# Patient Record
Sex: Female | Born: 1985 | Race: White | Hispanic: No | Marital: Single | Smoking: Never smoker
Health system: Southern US, Community
[De-identification: ages and names within clinical notes are randomized; demographics above are authoritative.]

## PROBLEM LIST (undated history)

## (undated) DIAGNOSIS — D480 Neoplasm of uncertain behavior of bone and articular cartilage: Secondary | ICD-10-CM

## (undated) DIAGNOSIS — W5501XA Bitten by cat, initial encounter: Secondary | ICD-10-CM

## (undated) DIAGNOSIS — D169 Benign neoplasm of bone and articular cartilage, unspecified: Secondary | ICD-10-CM

## (undated) HISTORY — PX: ELBOW SURGERY: SHX618

## (undated) HISTORY — PX: HIP SURGERY: SHX245

---

## 2015-07-10 DIAGNOSIS — W5501XA Bitten by cat, initial encounter: Secondary | ICD-10-CM

## 2015-07-10 HISTORY — DX: Bitten by cat, initial encounter: W55.01XA

## 2015-07-13 ENCOUNTER — Encounter (HOSPITAL_BASED_OUTPATIENT_CLINIC_OR_DEPARTMENT_OTHER): Payer: Self-pay | Admitting: Emergency Medicine

## 2015-07-13 DIAGNOSIS — Z3A14 14 weeks gestation of pregnancy: Secondary | ICD-10-CM

## 2015-07-13 DIAGNOSIS — Z23 Encounter for immunization: Secondary | ICD-10-CM

## 2015-07-13 DIAGNOSIS — L03114 Cellulitis of left upper limb: Secondary | ICD-10-CM | POA: Diagnosis present

## 2015-07-13 DIAGNOSIS — S61452A Open bite of left hand, initial encounter: Secondary | ICD-10-CM | POA: Diagnosis present

## 2015-07-13 DIAGNOSIS — W5501XA Bitten by cat, initial encounter: Secondary | ICD-10-CM

## 2015-07-13 DIAGNOSIS — M659 Synovitis and tenosynovitis, unspecified: Secondary | ICD-10-CM | POA: Diagnosis present

## 2015-07-13 DIAGNOSIS — O210 Mild hyperemesis gravidarum: Secondary | ICD-10-CM | POA: Diagnosis present

## 2015-07-13 DIAGNOSIS — O9A212 Injury, poisoning and certain other consequences of external causes complicating pregnancy, second trimester: Secondary | ICD-10-CM | POA: Diagnosis present

## 2015-07-13 DIAGNOSIS — L02512 Cutaneous abscess of left hand: Secondary | ICD-10-CM | POA: Diagnosis present

## 2015-07-13 DIAGNOSIS — B998 Other infectious disease: Secondary | ICD-10-CM | POA: Diagnosis present

## 2015-07-13 DIAGNOSIS — O99712 Diseases of the skin and subcutaneous tissue complicating pregnancy, second trimester: Principal | ICD-10-CM | POA: Diagnosis present

## 2015-07-13 DIAGNOSIS — D480 Neoplasm of uncertain behavior of bone and articular cartilage: Secondary | ICD-10-CM | POA: Diagnosis present

## 2015-07-13 NOTE — ED Notes (Signed)
Patient states that she was scratched by her cat to the left hand 2 days ago. The patient has pus and drainage to the area. The patient is [redacted] weeks pregnant

## 2015-07-14 ENCOUNTER — Emergency Department (HOSPITAL_COMMUNITY): Payer: Medicaid Other | Admitting: Anesthesiology

## 2015-07-14 ENCOUNTER — Inpatient Hospital Stay (HOSPITAL_BASED_OUTPATIENT_CLINIC_OR_DEPARTMENT_OTHER)
Admission: EM | Admit: 2015-07-14 | Discharge: 2015-07-15 | DRG: 781 | Disposition: A | Discharge status: Home / Self Care | Payer: Medicaid Other | Attending: Internal Medicine | Admitting: Internal Medicine

## 2015-07-14 ENCOUNTER — Encounter (HOSPITAL_COMMUNITY)
Admission: EM | Disposition: A | Discharge status: Home / Self Care | Payer: Self-pay | Source: Home / Self Care | Attending: Orthopedic Surgery

## 2015-07-14 ENCOUNTER — Emergency Department (HOSPITAL_BASED_OUTPATIENT_CLINIC_OR_DEPARTMENT_OTHER): Payer: Medicaid Other

## 2015-07-14 ENCOUNTER — Encounter (HOSPITAL_BASED_OUTPATIENT_CLINIC_OR_DEPARTMENT_OTHER): Payer: Self-pay | Admitting: Radiology

## 2015-07-14 DIAGNOSIS — W5501XD Bitten by cat, subsequent encounter: Secondary | ICD-10-CM | POA: Diagnosis not present

## 2015-07-14 DIAGNOSIS — W5501XA Bitten by cat, initial encounter: Secondary | ICD-10-CM | POA: Diagnosis not present

## 2015-07-14 DIAGNOSIS — D480 Neoplasm of uncertain behavior of bone and articular cartilage: Secondary | ICD-10-CM | POA: Diagnosis present

## 2015-07-14 DIAGNOSIS — S61459A Open bite of unspecified hand, initial encounter: Secondary | ICD-10-CM | POA: Diagnosis present

## 2015-07-14 DIAGNOSIS — M659 Synovitis and tenosynovitis, unspecified: Secondary | ICD-10-CM | POA: Diagnosis present

## 2015-07-14 DIAGNOSIS — S61452A Open bite of left hand, initial encounter: Secondary | ICD-10-CM | POA: Diagnosis present

## 2015-07-14 DIAGNOSIS — M65949 Unspecified synovitis and tenosynovitis, unspecified hand: Secondary | ICD-10-CM | POA: Diagnosis present

## 2015-07-14 DIAGNOSIS — Z23 Encounter for immunization: Secondary | ICD-10-CM | POA: Diagnosis not present

## 2015-07-14 DIAGNOSIS — L02512 Cutaneous abscess of left hand: Secondary | ICD-10-CM | POA: Diagnosis present

## 2015-07-14 DIAGNOSIS — D169 Benign neoplasm of bone and articular cartilage, unspecified: Secondary | ICD-10-CM | POA: Diagnosis present

## 2015-07-14 DIAGNOSIS — Z3A14 14 weeks gestation of pregnancy: Secondary | ICD-10-CM | POA: Diagnosis not present

## 2015-07-14 DIAGNOSIS — L03114 Cellulitis of left upper limb: Secondary | ICD-10-CM | POA: Diagnosis present

## 2015-07-14 DIAGNOSIS — O210 Mild hyperemesis gravidarum: Secondary | ICD-10-CM | POA: Diagnosis present

## 2015-07-14 DIAGNOSIS — B998 Other infectious disease: Secondary | ICD-10-CM | POA: Diagnosis present

## 2015-07-14 DIAGNOSIS — O99712 Diseases of the skin and subcutaneous tissue complicating pregnancy, second trimester: Secondary | ICD-10-CM | POA: Diagnosis present

## 2015-07-14 DIAGNOSIS — O9A212 Injury, poisoning and certain other consequences of external causes complicating pregnancy, second trimester: Secondary | ICD-10-CM | POA: Diagnosis present

## 2015-07-14 DIAGNOSIS — Z3492 Encounter for supervision of normal pregnancy, unspecified, second trimester: Secondary | ICD-10-CM

## 2015-07-14 DIAGNOSIS — S61452D Open bite of left hand, subsequent encounter: Secondary | ICD-10-CM | POA: Diagnosis not present

## 2015-07-14 HISTORY — DX: Neoplasm of uncertain behavior of bone and articular cartilage: D48.0

## 2015-07-14 HISTORY — DX: Benign neoplasm of bone and articular cartilage, unspecified: D16.9

## 2015-07-14 HISTORY — PX: I & D EXTREMITY: SHX5045

## 2015-07-14 HISTORY — PX: INCISION / DRAINAGE HAND / FINGER: SUR695

## 2015-07-14 HISTORY — DX: Bitten by cat, initial encounter: W55.01XA

## 2015-07-14 LAB — CBC WITH DIFFERENTIAL/PLATELET
BASOS ABS: 0 10*3/uL (ref 0.0–0.1)
BASOS PCT: 0 %
EOS ABS: 0.1 10*3/uL (ref 0.0–0.7)
Eosinophils Relative: 1 %
HEMATOCRIT: 41.2 % (ref 36.0–46.0)
HEMOGLOBIN: 14 g/dL (ref 12.0–15.0)
Lymphocytes Relative: 25 %
Lymphs Abs: 3.1 10*3/uL (ref 0.7–4.0)
MCH: 30 pg (ref 26.0–34.0)
MCHC: 34 g/dL (ref 30.0–36.0)
MCV: 88.2 fL (ref 78.0–100.0)
MONO ABS: 1 10*3/uL (ref 0.1–1.0)
MONOS PCT: 8 %
NEUTROS PCT: 66 %
Neutro Abs: 8.1 10*3/uL — ABNORMAL HIGH (ref 1.7–7.7)
Platelets: 255 10*3/uL (ref 150–400)
RBC: 4.67 MIL/uL (ref 3.87–5.11)
RDW: 13.2 % (ref 11.5–15.5)
WBC: 12.2 10*3/uL — ABNORMAL HIGH (ref 4.0–10.5)

## 2015-07-14 LAB — BASIC METABOLIC PANEL
ANION GAP: 11 (ref 5–15)
BUN: 10 mg/dL (ref 6–20)
CHLORIDE: 103 mmol/L (ref 101–111)
CO2: 20 mmol/L — AB (ref 22–32)
Calcium: 9.1 mg/dL (ref 8.9–10.3)
Creatinine, Ser: 0.57 mg/dL (ref 0.44–1.00)
GFR calc non Af Amer: >60 mL/min (ref >60)
Glucose, Bld: 107 mg/dL — ABNORMAL HIGH (ref 65–99)
POTASSIUM: 3.7 mmol/L (ref 3.5–5.1)
SODIUM: 134 mmol/L — AB (ref 135–145)

## 2015-07-14 SURGERY — IRRIGATION AND DEBRIDEMENT EXTREMITY
Anesthesia: Regional | Site: Hand | Laterality: Left

## 2015-07-14 MED ORDER — OXYCODONE HCL 5 MG PO TABS: 5.0000 mg | ORAL_TABLET | ORAL | Status: DC | PRN

## 2015-07-14 MED ORDER — ONDANSETRON HCL 4 MG/2ML IJ SOLN: 4.0000 mg | Freq: Four times a day (QID) | INTRAMUSCULAR | Status: DC | PRN

## 2015-07-14 MED ORDER — TETANUS-DIPHTH-ACELL PERTUSSIS 5-2.5-18.5 LF-MCG/0.5 IM SUSP
0.5000 mL | Freq: Once | INTRAMUSCULAR | Status: AC
  Administered 2015-07-14: 0.5 mL via INTRAMUSCULAR
  Filled 2015-07-14: qty 0.5

## 2015-07-14 MED ORDER — AMOXICILLIN-POT CLAVULANATE 875-125 MG PO TABS
1.0000 | ORAL_TABLET | Freq: Two times a day (BID) | ORAL | Status: DC
  Administered 2015-07-14 – 2015-07-15 (×2): 1 via ORAL
  Filled 2015-07-14 (×2): qty 1

## 2015-07-14 MED ORDER — MIDAZOLAM HCL 2 MG/2ML IJ SOLN
INTRAMUSCULAR | Status: AC
  Filled 2015-07-14: qty 2

## 2015-07-14 MED ORDER — DOXYLAMINE SUCCINATE (SLEEP) 25 MG PO TABS
25.0000 mg | ORAL_TABLET | Freq: Every evening | ORAL | Status: DC | PRN
  Filled 2015-07-14: qty 1

## 2015-07-14 MED ORDER — FENTANYL CITRATE (PF) 250 MCG/5ML IJ SOLN
INTRAMUSCULAR | Status: AC
  Filled 2015-07-14: qty 5

## 2015-07-14 MED ORDER — DIPHENHYDRAMINE HCL 25 MG PO CAPS: 25.0000 mg | ORAL_CAPSULE | Freq: Four times a day (QID) | ORAL | Status: DC | PRN

## 2015-07-14 MED ORDER — DOCUSATE SODIUM 100 MG PO CAPS: 100.0000 mg | ORAL_CAPSULE | Freq: Two times a day (BID) | ORAL | Status: DC

## 2015-07-14 MED ORDER — SODIUM CHLORIDE 0.9 % IV SOLN: 3.0000 g | Freq: Once | INTRAVENOUS | Status: DC

## 2015-07-14 MED ORDER — SODIUM CHLORIDE 0.9 % IR SOLN
Status: DC | PRN
  Administered 2015-07-14: 3000 mL

## 2015-07-14 MED ORDER — LIDOCAINE-EPINEPHRINE (PF) 1.5 %-1:200000 IJ SOLN
INTRAMUSCULAR | Status: DC | PRN
  Administered 2015-07-14: 30 mL via PERINEURAL

## 2015-07-14 MED ORDER — ACETAMINOPHEN 500 MG PO TABS: 500.0000 mg | ORAL_TABLET | Freq: Four times a day (QID) | ORAL | Status: DC | PRN

## 2015-07-14 MED ORDER — MORPHINE SULFATE (PF) 2 MG/ML IV SOLN: 2.0000 mg | INTRAVENOUS | Status: DC | PRN

## 2015-07-14 MED ORDER — MAGNESIUM CITRATE PO SOLN: 1.0000 | Freq: Once | ORAL | Status: DC | PRN

## 2015-07-14 MED ORDER — POTASSIUM CHLORIDE IN NACL 20-0.45 MEQ/L-% IV SOLN
INTRAVENOUS | Status: DC
  Filled 2015-07-14 (×4): qty 1000

## 2015-07-14 MED ORDER — SODIUM CHLORIDE 0.9 % IV SOLN
3.0000 g | Freq: Four times a day (QID) | INTRAVENOUS | Status: DC
  Administered 2015-07-14: 3 g via INTRAVENOUS
  Filled 2015-07-14 (×3): qty 3

## 2015-07-14 MED ORDER — HYDROMORPHONE HCL 1 MG/ML IJ SOLN: 0.2500 mg | INTRAMUSCULAR | Status: DC | PRN

## 2015-07-14 MED ORDER — SODIUM CHLORIDE 0.45 % IV SOLN
INTRAVENOUS | Status: DC
Start: 2015-07-14 — End: 2015-07-15
  Administered 2015-07-14 – 2015-07-15 (×2): via INTRAVENOUS

## 2015-07-14 MED ORDER — PROMETHAZINE HCL 25 MG RE SUPP: 12.5000 mg | Freq: Four times a day (QID) | RECTAL | Status: DC | PRN

## 2015-07-14 MED ORDER — ONDANSETRON HCL 4 MG PO TABS: 4.0000 mg | ORAL_TABLET | Freq: Four times a day (QID) | ORAL | Status: DC | PRN

## 2015-07-14 MED ORDER — BISACODYL 10 MG RE SUPP: 10.0000 mg | Freq: Every day | RECTAL | Status: DC | PRN

## 2015-07-14 MED ORDER — MEPERIDINE HCL 25 MG/ML IJ SOLN: 6.2500 mg | INTRAMUSCULAR | Status: DC | PRN

## 2015-07-14 MED ORDER — PRENATAL MULTIVITAMIN CH
1.0000 | ORAL_TABLET | Freq: Every day | ORAL | Status: DC
  Administered 2015-07-14 – 2015-07-15 (×2): 1 via ORAL
  Filled 2015-07-14 (×2): qty 1

## 2015-07-14 MED ORDER — SODIUM CHLORIDE 0.9 % IV SOLN
3.0000 g | Freq: Once | INTRAVENOUS | Status: AC
  Administered 2015-07-14: 3 g via INTRAVENOUS
  Filled 2015-07-14: qty 3

## 2015-07-14 MED ORDER — PYRIDOXINE HCL 25 MG PO TABS
25.0000 mg | ORAL_TABLET | Freq: Three times a day (TID) | ORAL | Status: DC | PRN
  Filled 2015-07-14: qty 1

## 2015-07-14 MED ORDER — FAMOTIDINE 20 MG PO TABS: 20.0000 mg | ORAL_TABLET | Freq: Two times a day (BID) | ORAL | Status: DC | PRN

## 2015-07-14 MED ORDER — PROPOFOL 10 MG/ML IV BOLUS
INTRAVENOUS | Status: AC
  Filled 2015-07-14: qty 20

## 2015-07-14 MED ORDER — PROPOFOL 1000 MG/100ML IV EMUL
INTRAVENOUS | Status: AC
  Filled 2015-07-14: qty 100

## 2015-07-14 MED ORDER — SENNA 8.6 MG PO TABS: 1.0000 | ORAL_TABLET | Freq: Two times a day (BID) | ORAL | Status: DC

## 2015-07-14 MED ORDER — ONDANSETRON HCL 4 MG/2ML IJ SOLN: 4.0000 mg | Freq: Once | INTRAMUSCULAR | Status: DC | PRN

## 2015-07-14 MED ORDER — POLYETHYLENE GLYCOL 3350 17 G PO PACK: 17.0000 g | PACK | Freq: Every day | ORAL | Status: DC | PRN

## 2015-07-14 MED ORDER — LACTATED RINGERS IV SOLN
INTRAVENOUS | Status: DC | PRN
  Administered 2015-07-14: 06:00:00 via INTRAVENOUS

## 2015-07-14 MED ORDER — VITAMIN C 500 MG PO TABS: 1000.0000 mg | ORAL_TABLET | Freq: Every day | ORAL | Status: DC

## 2015-07-14 SURGICAL SUPPLY — 41 items
BANDAGE ACE 4X5 VEL STRL LF (GAUZE/BANDAGES/DRESSINGS) ×3 IMPLANT
BANDAGE ELASTIC 4 VELCRO ST LF (GAUZE/BANDAGES/DRESSINGS) ×3 IMPLANT
BNDG CONFORM 2 STRL LF (GAUZE/BANDAGES/DRESSINGS) IMPLANT
BNDG GAUZE ELAST 4 BULKY (GAUZE/BANDAGES/DRESSINGS) ×3 IMPLANT
CORDS BIPOLAR (ELECTRODE) ×3 IMPLANT
CUFF TOURNIQUET SINGLE 18IN (TOURNIQUET CUFF) ×3 IMPLANT
CUFF TOURNIQUET SINGLE 24IN (TOURNIQUET CUFF) IMPLANT
DRSG ADAPTIC 3X8 NADH LF (GAUZE/BANDAGES/DRESSINGS) ×3 IMPLANT
GAUZE SPONGE 4X4 12PLY STRL (GAUZE/BANDAGES/DRESSINGS) ×3 IMPLANT
GAUZE XEROFORM 1X8 LF (GAUZE/BANDAGES/DRESSINGS) ×3 IMPLANT
GLOVE BIOGEL M STRL SZ7.5 (GLOVE) ×3 IMPLANT
GLOVE SS BIOGEL STRL SZ 8 (GLOVE) ×1 IMPLANT
GLOVE SUPERSENSE BIOGEL SZ 8 (GLOVE) ×2
GOWN STRL REUS W/ TWL LRG LVL3 (GOWN DISPOSABLE) ×1 IMPLANT
GOWN STRL REUS W/ TWL XL LVL3 (GOWN DISPOSABLE) ×2 IMPLANT
GOWN STRL REUS W/TWL LRG LVL3 (GOWN DISPOSABLE) ×2
GOWN STRL REUS W/TWL XL LVL3 (GOWN DISPOSABLE) ×4
HANDPIECE INTERPULSE COAX TIP (DISPOSABLE)
KIT BASIN OR (CUSTOM PROCEDURE TRAY) ×3 IMPLANT
KIT ROOM TURNOVER OR (KITS) ×3 IMPLANT
LOOP VESSEL MAXI BLUE (MISCELLANEOUS) ×3 IMPLANT
MANIFOLD NEPTUNE II (INSTRUMENTS) ×3 IMPLANT
NEEDLE HYPO 25GX1X1/2 BEV (NEEDLE) IMPLANT
NS IRRIG 1000ML POUR BTL (IV SOLUTION) ×3 IMPLANT
PACK ORTHO EXTREMITY (CUSTOM PROCEDURE TRAY) ×3 IMPLANT
PAD ARMBOARD 7.5X6 YLW CONV (MISCELLANEOUS) ×3 IMPLANT
PAD CAST 4YDX4 CTTN HI CHSV (CAST SUPPLIES) ×1 IMPLANT
PADDING CAST ABS 4INX4YD NS (CAST SUPPLIES) ×2
PADDING CAST ABS COTTON 4X4 ST (CAST SUPPLIES) ×1 IMPLANT
PADDING CAST COTTON 4X4 STRL (CAST SUPPLIES) ×2
SET HNDPC FAN SPRY TIP SCT (DISPOSABLE) IMPLANT
SPONGE GAUZE 4X4 12PLY STER LF (GAUZE/BANDAGES/DRESSINGS) ×3 IMPLANT
SPONGE LAP 4X18 X RAY DECT (DISPOSABLE) ×3 IMPLANT
SYR CONTROL 10ML LL (SYRINGE) IMPLANT
TOWEL OR 17X24 6PK STRL BLUE (TOWEL DISPOSABLE) ×3 IMPLANT
TOWEL OR 17X26 10 PK STRL BLUE (TOWEL DISPOSABLE) ×3 IMPLANT
TUBE ANAEROBIC SPECIMEN COL (MISCELLANEOUS) IMPLANT
TUBE CONNECTING 12'X1/4 (SUCTIONS) ×1
TUBE CONNECTING 12X1/4 (SUCTIONS) ×2 IMPLANT
WATER STERILE IRR 1000ML POUR (IV SOLUTION) ×3 IMPLANT
YANKAUER SUCT BULB TIP NO VENT (SUCTIONS) ×3 IMPLANT

## 2015-07-14 NOTE — Anesthesia Preprocedure Evaluation (Signed)
Anesthesia Evaluation  Patient identified by MRN, date of birth, ID band Patient awake    Reviewed: Allergy & Precautions, NPO status , Patient's Chart, lab work & pertinent test results  Airway Mallampati: I  TM Distance: >3 FB Neck ROM: Full    Dental   Pulmonary    Pulmonary exam normal        Cardiovascular Normal cardiovascular exam     Neuro/Psych    GI/Hepatic   Endo/Other    Renal/GU      Musculoskeletal   Abdominal   Peds  Hematology   Anesthesia Other Findings   Reproductive/Obstetrics (+) Pregnancy                             Anesthesia Physical Anesthesia Plan  ASA: II  Anesthesia Plan: Regional   Post-op Pain Management:    Induction: Intravenous  Airway Management Planned: Simple Face Mask  Additional Equipment:   Intra-op Plan:   Post-operative Plan:   Informed Consent: I have reviewed the patients History and Physical, chart, labs and discussed the procedure including the risks, benefits and alternatives for the proposed anesthesia with the patient or authorized representative who has indicated his/her understanding and acceptance.     Plan Discussed with: CRNA and Surgeon  Anesthesia Plan Comments:         Anesthesia Quick Evaluation

## 2015-07-14 NOTE — Anesthesia Postprocedure Evaluation (Signed)
Anesthesia Post Note  Patient: Sherri Garcia  Procedure(s) Performed: Procedure(s) (LRB): IRRIGATION AND DEBRIDEMENT EXTREMITY (Left)  Patient location during evaluation: PACU Anesthesia Type: Regional Level of consciousness: awake and alert and patient cooperative Pain management: pain level controlled Vital Signs Assessment: post-procedure vital signs reviewed and stable Respiratory status: spontaneous breathing and respiratory function stable Cardiovascular status: stable Anesthetic complications: no    Last Vitals:  Filed Vitals:   07/14/15 0301 07/14/15 0657  BP: 115/78 111/81  Pulse: 96 106  Temp:  36.7 C  Resp: 18 20    Last Pain:  Filed Vitals:   07/14/15 0706  PainSc: 0-No pain                 Marlicia Sroka DAVID

## 2015-07-14 NOTE — H&P (Signed)
[facility] History and Physical  Sherri Garcia [id number] DOB: 1986/03/15 DOA: 07/14/2015  Referring physician: Dr Amedeo Plenty PCP: No primary care provider on file.   Chief Complaint: Infected Cat Bite to hand  HPI: Sherri Garcia is a 30 y.o. female  Saturday pt sustained a cat bite to the L hand. Cat belonged to pt and was UTD on shots. Pt placed antibiotic ointment on wound regularly. Developed redness and increasing pain approximately 1-2 days ago w/ purulent discharge 1 days ago. Went to ED for further evaulataion. Pain was constant. Denies fevers, worsening nausea (has morning sickness), CP, SOB, palpitations, vaginal discharge, contractions, vaginal bleeding, gush of vaginal fluid. No fetal movements yet. US done last week by OB was nml.   Pt seen after operative debridement. Pt had LUE regional block  Fetal heart tones heard prior to procedure at 160.   Patient endorses regular prenatal care. Sees Dr. Roselie Awkward at [facility]. Patient is currently [redacted] weeks pregnant and takes her prenatal vitamin daily. Has not had any appreciable fetal movement at this point time. Patient states she had her first fetal ultrasound day before the Right and everything was measuring normal. Endorses daily nausea which is slowly improving which has been attributed to prenatal morning sickness.   Review of Systems:  Constitutional:  No weight loss, night sweats, Fevers, chills, fatigue.  HEENT:  No headaches, Difficulty swallowing,Tooth/dental problems,Sore throat, Cardio-vascular:  No chest pain, Orthopnea, PND, swelling in lower extremities, anasarca, dizziness, palpitations  GI:  No heartburn,  diarrhea, change in bowel habits,  Resp:   No shortness of breath with exertion or at rest. No excess mucus, no productive cough, No non-productive cough, No coughing up of blood.No change in color of mucus.No wheezing.No chest wall deformity  Skin:  no rash or lesions.  GU:  no dysuria, change in  color of urine, no urgency or frequency. No flank pain.  Musculoskeletal:   No joint pain or swelling. No decreased range of motion. No back pain.  Psych:  No change in mood or affect. No depression or anxiety. No memory loss.  Neuro:  No change in sensation, unilateral strength, or cognitive abilities  All other systems were reviewed and are negative.  Past Medical History  Diagnosis Date  . Osteochondromatosis    Past Surgical History  Procedure Laterality Date  . Hip surgery      sarcoma  . Elbow surgery     Social History:  reports that she has never smoked. She does not have any smokeless tobacco history on file. She reports that she does not drink alcohol or use illicit drugs.  No Known Allergies  Family History  Problem Relation Age of Onset  . Diabetes Brother      Prior to Admission medications   Medication Sig Start Date End Date Taking? Authorizing Provider  Prenatal Vit-Fe Fumarate-FA (PRENATAL MULTIVITAMIN) TABS tablet Take 1 tablet by mouth daily at 12 noon.   Yes Historical Provider, MD   Physical Exam: Filed Vitals:   07/14/15 0715 07/14/15 0800 07/14/15 0830 07/14/15 0851  BP: 108/73 109/74 110/71 115/69  Pulse: 108 108 107 102  Temp:  98.9 F (37.2 C) 98.7 F (37.1 C) 99.2 F (37.3 C)  TempSrc:    Oral  Resp: 21 18 20 20   Height:      Weight:      SpO2: 98% 97% 98% 100%    Wt Readings from Last 3 Encounters:  07/14/15 80.287 kg (177 lb)  General:  Appears calm and comfortable Eyes:  PERRL, EOMI, normal lids, iris ENT:  grossly normal hearing, lips & tongue Neck:  no LAD, masses or thyromegaly Cardiovascular:  RRR, no m/r/g. No LE edema.  Respiratory:  CTA bilaterally, no w/r/r. Normal respiratory effort. Abdomen:  soft, ntnd, Uterus felt on palpation Skin:  no rash or induration seen on limited exam Musculoskeletal:  grossly normal tone BUE/BLE, LUE bandaged and c/d/i. Psychiatric:  grossly normal mood and affect, speech fluent and  appropriate Neurologic:  CN 2-12 grossly intact, moves all extremities in coordinated fashion.          Labs on Admission:  Basic Metabolic Panel:  Recent Labs Lab 07/14/15 0158  NA 134*  K 3.7  CL 103  CO2 20*  GLUCOSE 107*  BUN 10  CREATININE 0.57  CALCIUM 9.1   Liver Function Tests: No results for input(s): AST, ALT, ALKPHOS, BILITOT, PROT, ALBUMIN in the last 168 hours. No results for input(s): LIPASE, AMYLASE in the last 168 hours. No results for input(s): AMMONIA in the last 168 hours. CBC:  Recent Labs Lab 07/14/15 0158  WBC 12.2*  NEUTROABS 8.1*  HGB 14.0  HCT 41.2  MCV 88.2  PLT 255   Cardiac Enzymes: No results for input(s): CKTOTAL, CKMB, CKMBINDEX, TROPONINI in the last 168 hours.  BNP (last 3 results) No results for input(s): BNP in the last 8760 hours.  ProBNP (last 3 results) No results for input(s): PROBNP in the last 8760 hours.   CREATININE: 0.57 (07/14/15 0158) Estimated creatinine clearance - 98.7 mL/min  CBG: No results for input(s): GLUCAP in the last 168 hours.  Radiological Exams on Admission: Dg Hand Complete Left  07/14/2015  CLINICAL DATA:  Swelling and purulent drainage in the posterior metacarpal area. Scratchy by a CT 4 days ago. EXAM: LEFT HAND - COMPLETE 3+ VIEW COMPARISON:  None. FINDINGS: No fracture or other acute bony abnormality is evident. There is a radiopaque 2 x 4 mm object adjacent to the palmar aspect of the distal third metacarpal; this could represent a foreign body although it is relation to the described dorsal swelling and CT scratch is uncertain. No soft tissue gas. No bony destruction. 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 IMPRESSION: No acute bony injury. Question a foreign body at the palmar aspect of the third metacarpal, indeterminate chronicity and doubtful relationship to the described history of a scratch at the dorsal surface of the hand.  No bony destruction. No soft tissue gas. Electronically Signed   By: Andreas Newport M.D.   On: 07/14/2015 02:30     Assessment/Plan Active Problems:   Cat bite of hand   Osteochondromatosis   Single pregnancy in second trimester   Morning sickness   Infected Cat Bite: Hand. I&D by Dr. Amedeo Plenty on 07/14/15. Bandage change planned for 07/15/15. Tetanus booster given. Cat was pts and is UTD on shots - f/u Dr. Amedeo Plenty recdommendations for wound management - Change ABX from Unasyn to augmentin ([facility]) - DC narcotics and start Tylenol for pain (pt states she won't take anything for pain). 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 No further mgt at this time - encouraged early and frequent ambulation  Pregnancy: Pt is a G1P0 at 14wks w/ excellent PNC by Dr. Roselie Awkward. Recent fetal US per pt was nml. On PNV daily. Fetal HR on admission 160 - continue PNV -  OB group aware of pt beign at hospital (discussed case w/ [place]) - fetal HR Q24 hrs (130-160 range) - vit B6 adn doxylamine for morning sickness if needed  Code Status: FULL  DVT Prophylaxis: SCD and early ambulation Family Communication: none Disposition Plan: Pending Improvement    MERRELL, DAVID Lenna Sciara, MD Family Medicine [facility] [email or web address] Password [email or web address]

## 2015-07-14 NOTE — Anesthesia Procedure Notes (Signed)
Anesthesia Regional Block:  Supraclavicular block  Pre-Anesthetic Checklist: ,, timeout performed, Correct Patient, Correct Site, Correct Laterality, Correct Procedure, Correct Position, site marked, Risks and benefits discussed,  Surgical consent,  Pre-op evaluation,  At surgeon's request and post-op pain management  Laterality: Left  Prep: chloraprep       Needles:   Needle Type: Echogenic Stimulator Needle     Needle Length: 9cm 9 cm Needle Gauge: 21 and 21 G    Additional Needles:  Procedures: ultrasound guided (picture in chart) and nerve stimulator Supraclavicular block  Nerve Stimulator or Paresthesia:  Response: 0.4 mA,   Additional Responses:   Narrative:  Start time: 07/14/2015 5:50 AM End time: 07/14/2015 6:00 AM Injection made incrementally with aspirations every 5 mL.  Performed by: Personally  Anesthesiologist: Lillia Abed  Additional Notes: Monitors applied. Patient sedated. Sterile prep and drape,hand hygiene and sterile gloves were used. Relevant anatomy identified.Needle position confirmed.Local anesthetic injected incrementally after negative aspiration. Local anesthetic spread visualized around nerve(s). Vascular puncture avoided. No complications. Image printed for medical record.The patient tolerated the procedure well.

## 2015-07-14 NOTE — Transfer of Care (Signed)
Immediate Anesthesia Transfer of Care Note  Patient: Sherri Garcia  Procedure(s) Performed: Procedure(s): IRRIGATION AND DEBRIDEMENT EXTREMITY (Left)  Patient Location: PACU  Anesthesia Type:Regional  Level of Consciousness: awake, alert , oriented and patient cooperative  Airway & Oxygen Therapy: Patient Spontanous Breathing  Post-op Assessment: Report given to RN and Post -op Vital signs reviewed and stable  Post vital signs: Reviewed and stable  Last Vitals:  Filed Vitals:   07/14/15 0301 07/14/15 0657  BP: 115/78   Pulse: 96 106  Temp:  36.7 C  Resp: 18 20    Complications: No apparent anesthesia complications

## 2015-07-14 NOTE — Op Note (Signed)
See [id number] Amedeo Plenty MD

## 2015-07-14 NOTE — H&P (Signed)
Sherri Garcia is an 30 y.o. female.   Chief Complaint: 3 day old infected cat bite left hand HPI: Patient is a female who is [redacted] weeks pregnant as a 34-day-old Bite infected. She was transferred from [facility]. She complains of pain intermittently. She has extruding purulence from the dorsal aspect of her hand. She notes no locking popping or catching.  I reviewed all issues with her at length. Her examination showed extruding pus. I have examined her in detail. She does have positive fetal heart tones in the range of 160 [place] we performed the fetal heart tones at bedside with nursing staff present.  History reviewed. No pertinent past medical history.  History reviewed. No pertinent past surgical history.  History reviewed. No pertinent family history. Social History:  reports that she has never smoked. She does not have any smokeless tobacco history on file. She reports that she does not drink alcohol or use illicit drugs.  Allergies: No Known Allergies   (Not in a hospital admission)  Results for orders placed or performed during the hospital encounter of 07/14/15 (from the past 48 hour(s))  CBC with Differential     Status: Abnormal   Collection Time: 07/14/15  1:58 AM  Result Value Ref Range   WBC 12.2 (H) 4.0 - 10.5 K/uL   RBC 4.67 3.87 - 5.11 MIL/uL   Hemoglobin 14.0 12.0 - 15.0 g/dL   HCT 41.2 36.0 - 46.0 %   MCV 88.2 78.0 - 100.0 fL   MCH 30.0 26.0 - 34.0 pg   MCHC 34.0 30.0 - 36.0 g/dL   RDW 13.2 11.5 - 15.5 %   Platelets 255 150 - 400 K/uL   Neutrophils Relative % 66 %   Neutro Abs 8.1 (H) 1.7 - 7.7 K/uL   Lymphocytes Relative 25 %   Lymphs Abs 3.1 0.7 - 4.0 K/uL   Monocytes Relative 8 %   Monocytes Absolute 1.0 0.1 - 1.0 K/uL   Eosinophils Relative 1 %   Eosinophils Absolute 0.1 0.0 - 0.7 K/uL   Basophils Relative 0 %   Basophils Absolute 0.0 0.0 - 0.1 K/uL  Basic metabolic panel     Status: Abnormal   Collection Time: 07/14/15  1:58 AM  Result Value  Ref Range   Sodium 134 (L) 135 - 145 mmol/L   Potassium 3.7 3.5 - 5.1 mmol/L    Comment: SLIGHT HEMOLYSIS   Chloride 103 101 - 111 mmol/L   CO2 20 (L) 22 - 32 mmol/L   Glucose, Bld 107 (H) 65 - 99 mg/dL   BUN 10 6 - 20 mg/dL   Creatinine, Ser 0.57 0.44 - 1.00 mg/dL   Calcium 9.1 8.9 - 10.3 mg/dL   GFR calc non Af Amer >60 >60 mL/min   GFR calc Af Amer >60 >60 mL/min    Comment: (NOTE) The eGFR has been calculated using the CKD EPI equation. This calculation has not been validated in all clinical situations. eGFR's persistently <60 mL/min signify possible Chronic Kidney Disease.    Anion gap 11 5 - 15   Dg Hand Complete Left  07/14/2015  CLINICAL DATA:  Swelling and purulent drainage in the posterior metacarpal area. Scratchy by a CT 4 days ago. EXAM: LEFT HAND - COMPLETE 3+ VIEW COMPARISON:  None. FINDINGS: No fracture or other acute bony abnormality is evident. There is a radiopaque 2 x 4 mm object adjacent to the palmar aspect of the distal third metacarpal; this could represent a foreign  body although it is relation to the described dorsal swelling and CT scratch is uncertain. No soft tissue gas. No bony destruction. 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 IMPRESSION: No acute bony injury. Question a foreign body at the palmar aspect of the third metacarpal, indeterminate chronicity and doubtful relationship to the described history of a scratch at the dorsal surface of the hand. No bony destruction. No soft tissue gas. Electronically Signed   By: Andreas Newport M.D.   On: 07/14/2015 02:30    Review of Systems  HENT: Negative.   Cardiovascular: Negative.   Gastrointestinal: Negative.   Genitourinary: Negative.   Neurological: Negative.   Endo/Heme/Allergies: Negative.   Psychiatric/Behavioral: Negative.     Blood pressure 115/78, pulse 96, temperature 98.4 F (36.9 C), temperature source Oral, resp. rate 18,  height 5' 1"  (1.549 m), weight 80.287 kg (177 lb), SpO2 100 %. Physical Exam white female with an infected left hand cat bite with dorsal tenosynovitis.  She has no signs of instability or compartment syndrome. She is sensate. She has 4 bite marks protruding from the dorsal aspect of her hand in 1 large scratch volarly. I reviewed this with her at length.  I should note she's been given Unasyn. She was transferred from [facility].  She and I discussed all issues in detail.  Fetal heart tones are in the 160s. Chest is clear abdomen's nontender heart is slightly tachycardic. Lower extremity examination is benign. She has a history of osteochondroma but this does not per se and her current upper extremity predicament  Assessment/Plan Infected cat bite left hand with abscess and dorsal tenosynovitis. I discussed with patient the findings and recommend emergent I and D given the severity of her infection. She concurs.  We'll plan to proceed with surgical evidence of care.  We'll plan for hospital admission I and D antibiotics careful monitoring of her medical status etc.  We are planning surgery for your upper extremity. The risk and benefits of surgery to include risk of bleeding, infection, anesthesia,  damage to normal structures and failure of the surgery to accomplish its intended goals of relieving symptoms and restoring function have been discussed in detail. With this in mind we plan to proceed. I have specifically discussed with the patient the pre-and postoperative regime and the dos and don'ts and risk and benefits in great detail. Risk and benefits of surgery also include risk of dystrophy(CRPS), chronic nerve pain, failure of the healing process to go onto completion and other inherent risks of surgery The relavent the pathophysiology of the disease/injury process, as well as the alternatives for treatment and postoperative course of action has been discussed in great detail  with the patient who desires to proceed.  We will do everything in our power to help you (the patient) restore function to the upper extremity. It is a pleasure to see this patient today.  Paulene Floor, MD 07/14/2015, 5:10 AM

## 2015-07-14 NOTE — ED Provider Notes (Signed)
Patient transferred from [facility] for hand surgery evaluation. Patient reports that her cat either bit her left hand or scratched her left hand several days ago and since then she has had progressively worsening pain and swelling. Examination consistent with soft tissue infection and she is transferred for consultation. At arrival, patient is without complaints. Awaiting Dr. Amedeo Plenty.  Orpah Greek, MD 07/14/15 [phone]

## 2015-07-14 NOTE — ED Notes (Signed)
Patient taken to OR, consent signed and valuables locked up with security at this time.

## 2015-07-14 NOTE — ED Provider Notes (Addendum)
CSN: [id number]     Arrival date & time 07/13/15  2304 History   First MD Initiated Contact with Patient 07/14/15 0047     Chief Complaint  Patient presents with  . Wound Infection     (Consider location/radiation/quality/duration/timing/severity/associated sxs/prior Treatment) HPI  This is a 30 year old G1 P0 female currently [redacted] weeks pregnant who presents with infection of the left hand. Patient states that she was sleeping with her cat Either bit or scratched her left hand on Saturday. She is uncertain which. She states the she has been using Neosporin and liquid Band-Aid but has noticed that the area has gotten more red, swollen, and tender. She is also noted purulent drainage. She denies any fevers. She is right-handed. Current pain is 3 out of 10. She's not taken anything for pain at home because of her pregnancy. Patient reports normal uncomplicated pregnancy to this point.Cat is up-to-date on vaccinations.  History reviewed. No pertinent past medical history.   Osteochondromatosis  History reviewed. No pertinent past surgical history. History reviewed. No pertinent family history. Social History  Substance Use Topics  . Smoking status: Never Smoker   . Smokeless tobacco: None  . Alcohol Use: No   OB History    Gravida Para Term Preterm AB TAB SAB Ectopic Multiple Living   1              Review of Systems  Constitutional: Negative for fever.  Genitourinary: Negative for vaginal bleeding and vaginal discharge.  Skin: Positive for color change and wound.  All other systems reviewed and are negative.     Allergies  Review of patient's allergies indicates no known allergies.  Home Medications   Prior to Admission medications   Medication Sig Start Date End Date Taking? Authorizing Provider  Prenatal Vit-Fe Fumarate-FA (PRENATAL MULTIVITAMIN) TABS tablet Take 1 tablet by mouth daily at 12 noon.   Yes Historical Provider, MD   BP 115/78 mmHg  Pulse 96  Temp(Src)  98.4 F (36.9 C) (Oral)  Resp 18  Ht 5\' 1"  (1.549 m)  Wt 177 lb (80.287 kg)  BMI 33.46 kg/m2  SpO2 100%  LMP  Physical Exam  Constitutional: She is oriented to person, place, and time. She appears well-developed and well-nourished. No distress.  HENT:  Head: Normocephalic and atraumatic.  Cardiovascular: Normal rate, regular rhythm and normal heart sounds.   Pulmonary/Chest: Effort normal and breath sounds normal. No respiratory distress. She has no wheezes.  Musculoskeletal:   Multiple punctate wounds over the dorsum of the hand with surrounding erythema, purulent drainage noted from to open wounds along the proximal end of the erythema, flexion and extension of the fingers and wrist intact, mild associated swelling Noted foreshortening of the limbs and fingers  Neurological: She is alert and oriented to person, place, and time.  Skin: Skin is warm and dry.  Psychiatric: She has a normal mood and affect.  Nursing note and vitals reviewed.     ED Course  Procedures (including critical care time) Labs Review Labs Reviewed  CBC WITH DIFFERENTIAL/PLATELET - Abnormal; Notable for the following:    WBC 12.2 (*)    Neutro Abs 8.1 (*)    All other components within normal limits  BASIC METABOLIC PANEL - Abnormal; Notable for the following:    Sodium 134 (*)    CO2 20 (*)    Glucose, Bld 107 (*)    All other components within normal limits  CULTURE, BLOOD (ROUTINE X 2)  CULTURE, BLOOD (ROUTINE  X 2)  WOUND CULTURE    Imaging Review Dg Hand Complete Left  07/14/2015  CLINICAL DATA:  Swelling and purulent drainage in the posterior metacarpal area. Scratchy by a CT 4 days ago. EXAM: LEFT HAND - COMPLETE 3+ VIEW COMPARISON:  None. FINDINGS: No fracture or other acute bony abnormality is evident. There is a radiopaque 2 x 4 mm object adjacent to the palmar aspect of the distal third metacarpal; this could represent a foreign body although it is relation to the described dorsal swelling  and CT scratch is uncertain. No soft tissue gas. No bony destruction. 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 IMPRESSION: No acute bony injury. Question a foreign body at the palmar aspect of the third metacarpal, indeterminate chronicity and doubtful relationship to the described history of a scratch at the dorsal surface of the hand. No bony destruction. No soft tissue gas. Electronically Signed   By: Andreas Newport M.D.   On: 07/14/2015 02:30   I have personally reviewed and evaluated these images and lab results as part of my medical decision-making.   EKG Interpretation None     Medications  Tdap (BOOSTRIX) injection 0.5 mL (not administered)  Ampicillin-Sulbactam (UNASYN) 3 g in sodium chloride 0.9 % 100 mL IVPB (0 g Intravenous Stopped 07/14/15 0321)    MDM   Final diagnoses:  Cat bite of hand, left, initial encounter    Patient presents with bite to left hand. Occurred 3 days ago. Has had progressive purulent drainage and redness. It appears acutely infected. She has intact flexion and extension and no proximal extension beyond the hand. She is otherwise nontoxic. Afebrile. Mild leukocytosis. Blood cultures and wound culture sent. Patient was given Unasyn and DTaP  was updated.  Discussed the case with Dr. Amedeo Plenty on for hand surgery.  Will transfer patient to the [facility] ER for evaluation. She will likely need admission by the hospitalist after evaluation by Dr. Amedeo Plenty. Discussed the patient with Dr. Jeneen Rinks who is accepted the patient in transfer. He was given Dr. Vanetta Shawl personal cell phone number to contact upon arrival.  EMTALA filled out.    Merryl Hacker, MD 07/14/15 [id number]  Merryl Hacker, MD 07/14/15 [phone]

## 2015-07-14 NOTE — Progress Notes (Signed)
Pharmacy Antibiotic Note  Monte Hindman is a 30 y.o. female admitted on 07/14/2015 with animal bite. S/p I&D on 4/5. Pharmacy has been consulted for Unasyn dosing. AF, wbc 12.2, CrCl~98. Noted patient is pregnant - Unasyn is pregnancy category B medication.  Plan: Unasyn 3g IV q6h Monitor clinical progress, c/s, renal function, abx plan/LOT   Height: 5\' 1"  (154.9 cm) Weight: 177 lb (80.287 kg) IBW/kg (Calculated) : 47.8  Temp (24hrs), Avg:98.7 F (37.1 C), Min:98.1 F (36.7 C), Max:99.2 F (37.3 C)   Recent Labs Lab 07/14/15 0158  WBC 12.2*  CREATININE 0.57    Estimated Creatinine Clearance: 98.7 mL/min (by C-G formula based on Cr of 0.57).    No Known Allergies  Antimicrobials this admission: 4/5 unasyn >>   Dose adjustments this admission: n/a  Microbiology results: 4/5 BCx:  4/5 Wound:    Elicia Lamp, PharmD, [facility] Clinical Pharmacist Pager [phone] 07/14/2015 9:03 AM

## 2015-07-14 NOTE — Op Note (Signed)
Sherri Garcia, Sherri Garcia               ACCOUNT NO.:  [id number]  MEDICAL RECORD NO.:  [id number]  LOCATION:  [facility]                         FACILITY:  [place]  PHYSICIAN:  Satira Anis. Aquarius Latouche, M.D.DATE OF BIRTH:  03/21/1986  DATE OF PROCEDURE: DATE OF DISCHARGE:                              OPERATIVE REPORT   PREOPERATIVE DIAGNOSIS:  Cat bite with dorsal extensor tenosynovitis, left hand.  POSTOPERATIVE DIAGNOSIS:  Cat bite with dorsal extensor tenosynovitis, left hand.  PROCEDURES: 1. I and D, deep abscess, left hand.  This was skin, subcutaneous     tissue, muscle, and tendon involvement.  Excisional debridement in     nature with scissor, knife, blade, and curette for a depth of 0.5     cm. 2. Extensor tenosynovectomy radical in nature including the EDC to the     index, middle, and ring finger as well as the EIP tendon to the     index finger.  SURGEON:  Satira Anis. Amedeo Plenty, M.D.  ASSISTANT:  None.  COMPLICATIONS:  None.  ANESTHESIA:  Block anesthetic.  INDICATIONS:  This is a 14-week pregnant female, cat bite x3 days, presents to [facility].  She was referred in for my evaluation.  She has been given Unasyn.  She has been counseled.  I have discussed the risks and benefits of surgery including risk of infection, bleeding, anesthesia, damage to normal structures, and failure of surgery to accomplish its intended goals of relieving symptoms and restoring function.  With this in mind, she desires to proceed.  DESCRIPTION OF PROCEDURE:  The patient was seen by myself, underwent a block anesthetic in the form of infraclavicular block by Dr. Conrad Ogden.  She was prepped and draped in usual sterile fashion with Betadine scrub and paint.  Following this, arm was elevated, tourniquet was insufflated. The patient underwent I and D of skin, subcutaneous tissue, muscle, and tendon about 4 different puncture sites.  I enlarged this with a 15 blade scalpel.  Following this, I then  took cultures.  She has gross purulence.  I debrided aggressively skin, subcutaneous tissue, muscle, and tendon utilizing curette, knife, blade, and scissor.  Following this, I performed a radical extensor tenolysis, tenosynovectomy of the EDC to the index, middle, and ring finger and the EIP to the index finger.  The patient tolerated this well.  Following this, I placed 3 L of fluid through and through the 4 puncture wounds. I connected these areas with vessel loop drains and left them open. After this of course I placed a sterile bandage.  Tourniquet was deflated during the irrigation process.  The patient tolerated the procedure well.  She has a scratch on the volar radial surface of her arm.  I did not have to aggressively go after this in terms of any surgical debridement.  She will be admitted, IV Unasyn, general postop observation algorithm will be adhered to.  Should any problems occur, she will notify me.  It is pleasure to see her today and participate in her care.     Satira Anis. Amedeo Plenty, M.D.     [facility]  D:  07/14/2015  T:  07/14/2015  Job:  [id number]

## 2015-07-14 NOTE — Care Management Note (Signed)
Case Management Note  Patient Details  Name: Sherri Garcia MRN: [id number] Date of Birth: Mar 30, 1986  Subjective/Objective:                    Action/Plan:  Initial UR completed  Expected Discharge Date:                  Expected Discharge Plan:  Home/Self Care  In-House Referral:     Discharge planning Services     Post Acute Care Choice:    Choice offered to:     DME Arranged:    DME Agency:     HH Arranged:    [place] Agency:     Status of Service:  In process, will continue to follow  Medicare Important Message Given:    Date Medicare IM Given:    Medicare IM give by:    Date Additional Medicare IM Given:    Additional Medicare Important Message give by:     If discussed at [place] of Stay Meetings, dates discussed:    Additional Comments:  Marilu Favre, RN 07/14/2015, 10:49 AM

## 2015-07-14 NOTE — ED Notes (Signed)
Patient transported to X-ray 

## 2015-07-15 ENCOUNTER — Encounter (HOSPITAL_COMMUNITY): Payer: Self-pay | Admitting: Orthopedic Surgery

## 2015-07-15 DIAGNOSIS — L02512 Cutaneous abscess of left hand: Secondary | ICD-10-CM | POA: Diagnosis present

## 2015-07-15 DIAGNOSIS — M659 Synovitis and tenosynovitis, unspecified: Secondary | ICD-10-CM | POA: Diagnosis present

## 2015-07-15 DIAGNOSIS — S61452D Open bite of left hand, subsequent encounter: Secondary | ICD-10-CM

## 2015-07-15 DIAGNOSIS — W5501XD Bitten by cat, subsequent encounter: Secondary | ICD-10-CM

## 2015-07-15 LAB — COMPREHENSIVE METABOLIC PANEL
ALT: 12 U/L — ABNORMAL LOW (ref 14–54)
ANION GAP: 11 (ref 5–15)
AST: 33 U/L (ref 15–41)
Albumin: 2.9 g/dL — ABNORMAL LOW (ref 3.5–5.0)
Alkaline Phosphatase: 55 U/L (ref 38–126)
BUN: <5 mg/dL — ABNORMAL LOW (ref 6–20)
CALCIUM: 8.6 mg/dL — AB (ref 8.9–10.3)
CHLORIDE: 106 mmol/L (ref 101–111)
CO2: 19 mmol/L — AB (ref 22–32)
Creatinine, Ser: 0.44 mg/dL (ref 0.44–1.00)
GFR calc non Af Amer: >60 mL/min (ref >60)
Glucose, Bld: 78 mg/dL (ref 65–99)
Potassium: 4.6 mmol/L (ref 3.5–5.1)
SODIUM: 136 mmol/L (ref 135–145)
Total Bilirubin: 1.4 mg/dL — ABNORMAL HIGH (ref 0.3–1.2)
Total Protein: 5.7 g/dL — ABNORMAL LOW (ref 6.5–8.1)

## 2015-07-15 LAB — CBC
HCT: 36.1 % (ref 36.0–46.0)
Hemoglobin: 12 g/dL (ref 12.0–15.0)
MCH: 30.1 pg (ref 26.0–34.0)
MCHC: 33.2 g/dL (ref 30.0–36.0)
MCV: 90.5 fL (ref 78.0–100.0)
PLATELETS: 181 10*3/uL (ref 150–400)
RBC: 3.99 MIL/uL (ref 3.87–5.11)
RDW: 13.6 % (ref 11.5–15.5)
WBC: 8.6 10*3/uL (ref 4.0–10.5)

## 2015-07-15 MED ORDER — METRONIDAZOLE 500 MG PO TABS: 500.0000 mg | ORAL_TABLET | Freq: Three times a day (TID) | ORAL | Status: AC

## 2015-07-15 MED ORDER — AMOXICILLIN-POT CLAVULANATE 875-125 MG PO TABS: 1.0000 | ORAL_TABLET | Freq: Two times a day (BID) | ORAL | Status: AC

## 2015-07-15 MED ORDER — ACETAMINOPHEN 500 MG PO TABS: 500.0000 mg | ORAL_TABLET | Freq: Four times a day (QID) | ORAL | Status: AC | PRN

## 2015-07-15 NOTE — Discharge Summary (Signed)
Physician Discharge Summary  Kellyjo Turnbaugh [id number] DOB: 10-28-1985 DOA: 07/14/2015  PCP: No primary care provider on file.  Admit date: 07/14/2015 Discharge date: 07/15/2015  Time spent: 25 minutes  Recommendations for Outpatient Follow-up:  1. Discharge home with outpatient follow-up with hand surgeon Dr. Amedeo Plenty 2. Patient will complete 10 day course of oral antibiotics with Augmentin and Flagyl on 07/25/2015 3. Follow-up with her OB as scheduled.   Discharge Diagnoses:  Principal Problem:   Abscess of left hand   Active Problems:   Cat bite of hand   Osteochondromatosis   Single pregnancy in second trimester   Tenosynovitis of hand   Discharge Condition: Fair  Diet recommendation: Regular  Filed Weights   07/14/15 0301  Weight: 80.287 kg (177 lb)    History of present illness:  30 year old female who is [redacted] weeks pregnant presented with a cat bite in her left hand. The cat was her pet and was up to date with shots. She applied antibiotic ointment on the wound were developed redness and increased pain about 2 days prior to admission with purulent discharge. Patient presented to the ED with severe pain. Denied fevers, chills, headache, dizziness, vaginal contractions or bleeding. Patient admitted to hospitalist service and hand surgeon consulted. The ED vitals were stable. Blood work showed mild leukocytosis. X-ray of the hand showed no significant abnormality.  Hospital Course:  Cat bite cellulitis of the left hand with deep abscess Given empiric Unasyn, subsequently placed on oral Augmentin following admission. Patient seen by hand surgery and underwent I&D of the deep abscess of the left hand with excisional debridement and extensor tendon synovectomy. Tolerated well. Has not required narcotics. Taking when necessary Tylenol. Has been stable this morning. Underwent hydrotherapy by PT. I will discharge patient home on oral Augmentin and Flagyl (to cover for  anaerobes). She will follow-up with Dr. Amedeo Plenty in good condition. He will evaluate the patient prior to her discharge today.  Early second trimester pregnancy G1 P0 at 14 weeks. Has irregular prenatal care with her OB. Fetal heart rate monitored on admission. Continue prenatal vitamins   Procedures:  Incision and drainage of left hand abscess, extensor tenosynovectomy  Consultations:  Dr Amedeo Plenty  Discharge Exam: Filed Vitals:   07/15/15 0525 07/15/15 1302  BP: 101/67 109/68  Pulse: 96 94  Temp: 98.4 F (36.9 C) 97.9 F (36.6 C)  Resp: 18 18    General: young female in NAD  HEENT: no pallor, moist mucosa Cardiovascular: NS1&S2, no murmurs Respiratory: clear b/l GI: soft, NT, ND, BS+ Musculoskeletal: dressing over left hand,    Discharge Instructions    Current Discharge Medication List    START taking these medications   Details  acetaminophen (TYLENOL) 500 MG tablet Take 1-2 tablets (500-1,000 mg total) by mouth every 6 (six) hours as needed for mild pain or headache. Qty: 30 tablet, Refills: 0    amoxicillin-clavulanate (AUGMENTIN) 875-125 MG tablet Take 1 tablet by mouth 2 (two) times daily. Qty: 20 tablet, Refills: 0    metroNIDAZOLE (FLAGYL) 500 MG tablet Take 1 tablet (500 mg total) by mouth 3 (three) times daily. Qty: 30 tablet, Refills: 0      CONTINUE these medications which have NOT CHANGED   Details  Prenatal Vit-Fe Fumarate-FA (PRENATAL MULTIVITAMIN) TABS tablet Take 1 tablet by mouth daily.        No Known Allergies Follow-up Information    Follow up with Paulene Floor, MD. Call in 1 week.   Specialty:  Orthopedic  Surgery   Contact information:   [address] [place] [address] [place] [address] [phone]        The results of significant diagnostics from this hospitalization (including imaging, microbiology, ancillary and laboratory) are listed below for reference.    Significant Diagnostic Studies: Dg Hand Complete  Left  07/26/15  CLINICAL DATA:  Swelling and purulent drainage in the posterior metacarpal area. Scratchy by a CT 4 days ago. EXAM: LEFT HAND - COMPLETE 3+ VIEW COMPARISON:  None. FINDINGS: No fracture or other acute bony abnormality is evident. There is a radiopaque 2 x 4 mm object adjacent to the palmar aspect of the distal third metacarpal; this could represent a foreign body although it is relation to the described dorsal swelling and CT scratch is uncertain. No soft tissue gas. No bony destruction. 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 IMPRESSION: No acute bony injury. Question a foreign body at the palmar aspect of the third metacarpal, indeterminate chronicity and doubtful relationship to the described history of a scratch at the dorsal surface of the hand. No bony destruction. No soft tissue gas. Electronically Signed   By: Andreas Newport M.D.   On: July 26, 2015 02:30    Microbiology: Recent Results (from the past 240 hour(s))  Blood culture (routine x 2)     Status: None (Preliminary result)   Collection Time: 26-Jul-2015  1:55 AM  Result Value Ref Range Status   Specimen Description BLOOD RIGHT WRIST  Final   Special Requests BOTTLES DRAWN AEROBIC AND ANAEROBIC 5CC  Final   Culture   Final    NO GROWTH 1 DAY Performed at [facility]    Report Status PENDING  Incomplete  Blood culture (routine x 2)     Status: None (Preliminary result)   Collection Time: 07-26-2015  1:58 AM  Result Value Ref Range Status   Specimen Description BLOOD LEFT ANTECUBITAL  Final   Special Requests   Final    BOTTLES DRAWN AEROBIC AND ANAEROBIC AER 10cc ANA 5cc   Culture   Final    NO GROWTH 1 DAY Performed at [facility]    Report Status PENDING  Incomplete  Wound culture     Status: None (Preliminary result)   Collection Time: 07-26-15  3:15 AM  Result Value Ref Range Status   Specimen Description ABSCESS HAND LEFT   Final   Special Requests Normal  Final   Gram Stain   Final    NO WBC SEEN RARE SQUAMOUS EPITHELIAL CELLS PRESENT NO ORGANISMS SEEN Performed at [facility]    Culture   Final    Culture reincubated for better growth Performed at [facility]    Report Status PENDING  Incomplete  Anaerobic culture     Status: None (Preliminary result)   Collection Time: July 26, 2015  6:37 AM  Result Value Ref Range Status   Specimen Description WOUND LEFT HAND  Final   Special Requests POF ZOSYN  Final   Gram Stain   Final    NO WBC SEEN RARE SQUAMOUS EPITHELIAL CELLS PRESENT NO ORGANISMS SEEN Performed at [facility]    Culture PENDING  Incomplete   Report Status PENDING  Incomplete  Wound culture     Status: None (Preliminary result)   Collection Time: 07-26-15  6:37 AM  Result Value Ref Range Status   Specimen Description WOUND LEFT HAND  Final   Special Requests POF ZOSYN  Final  Gram Stain   Final    NO WBC SEEN RARE SQUAMOUS EPITHELIAL CELLS PRESENT NO ORGANISMS SEEN Performed at [facility]    Culture PENDING  Incomplete   Report Status PENDING  Incomplete     Labs: Basic Metabolic Panel:  Recent Labs Lab 07/14/15 0158 07/15/15 0435  NA 134* 136  K 3.7 4.6  CL 103 106  CO2 20* 19*  GLUCOSE 107* 78  BUN 10 <5*  CREATININE 0.57 0.44  CALCIUM 9.1 8.6*   Liver Function Tests:  Recent Labs Lab 07/15/15 0435  AST 33  ALT 12*  ALKPHOS 55  BILITOT 1.4*  PROT 5.7*  ALBUMIN 2.9*   No results for input(s): LIPASE, AMYLASE in the last 168 hours. No results for input(s): AMMONIA in the last 168 hours. CBC:  Recent Labs Lab 07/14/15 0158 07/15/15 0435  WBC 12.2* 8.6  NEUTROABS 8.1*  --   HGB 14.0 12.0  HCT 41.2 36.1  MCV 88.2 90.5  PLT 255 181   Cardiac Enzymes: No results for input(s): CKTOTAL, CKMB, CKMBINDEX, TROPONINI in the last 168 hours. BNP: BNP (last 3 results) No results for input(s): BNP in the last 8760  hours.  ProBNP (last 3 results) No results for input(s): PROBNP in the last 8760 hours.  CBG: No results for input(s): GLUCAP in the last 168 hours.     Signed:  Louellen Molder MD.  [facility] 07/15/2015, 3:31 PM

## 2015-07-15 NOTE — Progress Notes (Addendum)
Attempting to locate resource to check fetal HT on patient this am, OB rapid response nurse unable to come at this time. Will get small doppler from ED and see what we can do, large doppler does not leave ED per their charge nurse. Discussed with Dr. Clementeen Graham. He will dc order for FHT.

## 2015-07-15 NOTE — Progress Notes (Signed)
Subjective: 1 Day Post-Op Procedure(s) (LRB): IRRIGATION AND DEBRIDEMENT EXTREMITY (Left) Patient reports pain as improved overall, she states the swelling and pain much better today. She has been seen and evaluated by therapy earlier and was shown how to perform her wound care/wet-to-dry dressing changes. States she is very comfortable with this. She denies fevers or chills.  Objective: Vital signs in last 24 hours: Temp:  [97.9 F (36.6 C)-98.9 F (37.2 C)] 97.9 F (36.6 C) (04/06 1302) Pulse Rate:  [81-96] 94 (04/06 1302) Resp:  [18] 18 (04/06 1302) BP: (98-111)/(53-68) 109/68 mmHg (04/06 1302) SpO2:  [99 %-100 %] 100 % (04/06 1302)  Intake/Output from previous day: 04/05 0701 - 04/06 0700 In: 2413 [P.O.:960; I.V.:1453] Out: 1450 [Urine:1450] Intake/Output this shift: Total I/O In: 1324 [P.O.:660; I.V.:664] Out: -    Recent Labs  07/14/15 0158 07/15/15 0435  HGB 14.0 12.0    Recent Labs  07/14/15 0158 07/15/15 0435  WBC 12.2* 8.6  RBC 4.67 3.99  HCT 41.2 36.1  PLT 255 181    Recent Labs  07/14/15 0158 07/15/15 0435  NA 134* 136  K 3.7 4.6  CL 103 106  CO2 20* 19*  BUN 10 <5*  CREATININE 0.57 0.44  GLUCOSE 107* 78  CALCIUM 9.1 8.6*   Results for orders placed or performed during the hospital encounter of 07/14/15  Blood culture (routine x 2)     Status: None (Preliminary result)   Collection Time: 07/14/15  1:55 AM  Result Value Ref Range Status   Specimen Description BLOOD RIGHT WRIST  Final   Special Requests BOTTLES DRAWN AEROBIC AND ANAEROBIC 5CC  Final   Culture   Final    NO GROWTH 1 DAY Performed at [facility]    Report Status PENDING  Incomplete  Blood culture (routine x 2)     Status: None (Preliminary result)   Collection Time: 07/14/15  1:58 AM  Result Value Ref Range Status   Specimen Description BLOOD LEFT ANTECUBITAL  Final   Special Requests   Final    BOTTLES DRAWN AEROBIC AND ANAEROBIC AER 10cc ANA 5cc   Culture    Final    NO GROWTH 1 DAY Performed at [facility]    Report Status PENDING  Incomplete  Wound culture     Status: None (Preliminary result)   Collection Time: 07/14/15  3:15 AM  Result Value Ref Range Status   Specimen Description ABSCESS HAND LEFT  Final   Special Requests Normal  Final   Gram Stain   Final    NO WBC SEEN RARE SQUAMOUS EPITHELIAL CELLS PRESENT NO ORGANISMS SEEN Performed at [facility]    Culture   Final    Culture reincubated for better growth Performed at [facility]    Report Status PENDING  Incomplete  Anaerobic culture     Status: None (Preliminary result)   Collection Time: 07/14/15  6:37 AM  Result Value Ref Range Status   Specimen Description WOUND LEFT HAND  Final   Special Requests POF ZOSYN  Final   Gram Stain   Final    NO WBC SEEN RARE SQUAMOUS EPITHELIAL CELLS PRESENT NO ORGANISMS SEEN Performed at [facility]    Culture PENDING  Incomplete   Report Status PENDING  Incomplete  Wound culture     Status: None (Preliminary result)   Collection Time: 07/14/15  6:37 AM  Result Value Ref Range Status   Specimen Description WOUND LEFT HAND  Final   Special Requests POF ZOSYN  Final   Gram Stain   Final    NO WBC SEEN RARE SQUAMOUS EPITHELIAL CELLS PRESENT NO ORGANISMS SEEN Performed at [facility]    Culture PENDING  Incomplete   Report Status PENDING  Incomplete    Examination of the left upper extremity: Dressings are removed, as no significant erythema minimal edema no signs of infection no purulent drainage or discharge. She has no signs of ascending cellulitis. She is flexing and extending the fingers that significant pain or difficulties.  Assessment/Plan: 1 Day Post-Op Procedure(s) (LRB): IRRIGATION AND DEBRIDEMENT EXTREMITY (Left) We have discussed with her once again performing wet-to-dry dressing changes 1-2 times daily. Have her see [place] Monday morning at 8 AM for a wound check. She  will continue antibiotic regime as directed. All questions were encouraged and answered.  Ohn Bostic L 07/15/2015, 6:14 PM

## 2015-07-15 NOTE — Discharge Instructions (Signed)
Animal Bite °Animal bite wounds can get infected. It is important to get proper medical treatment. Ask your doctor if you need rabies treatment. °HOME CARE  °Wound Care °· Follow instructions from your doctor about how to take care of your wound. Make sure you: °¨ Wash your hands with soap and water before you change your bandage (dressing). If you cannot use soap and water, use hand sanitizer. °¨ Change your bandage as told by your doctor. °¨ Leave stitches (sutures), skin glue, or skin tape (adhesive) strips in place. They may need to stay in place for 2 weeks or longer. If tape strips get loose and curl up, you may trim the loose edges. Do not remove tape strips completely unless your doctor says it is okay. °· Check your wound every day for signs of infection. Watch for:   °¨   Redness, swelling, or pain that gets worse.   °¨   Fluid, blood, or pus.   °General Instructions °· Take or apply over-the-counter and prescription medicines only as told by your doctor.   °· If you were prescribed an antibiotic, take or apply it as told by your doctor. Do not stop using the antibiotic even if your condition improves.   °· Keep the injured area raised (elevated) above the level of your heart while you are sitting or lying down. °· If directed, apply ice to the injured area.   °¨   Put ice in a plastic bag.   °¨   Place a towel between your skin and the bag.   °¨   Leave the ice on for 20 minutes, 2-3 times per day.   °· Keep all follow-up visits as told by your doctor. This is important.   °GET HELP IF: °· You have redness, swelling, or pain that gets worse.   °· You have a general feeling of sickness (malaise).   °· You feel sick to your stomach (nauseous). °· You throw up (vomit).   °· You have pain that does not get better.   °GET HELP RIGHT AWAY IF:  °· You have a red streak going away from your wound.   °· You have fluid, blood, or pus coming from your wound.   °· You have a fever or chills.   °· You have trouble  moving your injured area.   °· You have numbness or tingling anywhere on your body.   °  °This information is not intended to replace advice given to you by your health care provider. Make sure you discuss any questions you have with your health care provider. °  °Document Released: 03/27/2005 Document Revised: 12/16/2014 Document Reviewed: 08/12/2014 °Elsevier Interactive Patient Education ©2016 [employer] ° °

## 2015-07-15 NOTE — Discharge Planning (Signed)
AVS reviewed and given to patient who verbalizes understanding. PT di teaching on dressing change during [facility] therapy and patient verbalizes understanding. Given rx x2 for antibiotics.  Wil dc to private car home with all personal belongings accompanied by friend.

## 2015-07-15 NOTE — Progress Notes (Signed)
Physical Therapy Wound Evaluation/Treatment Patient Details  Name: Sherri Garcia MRN: [id number] Date of Birth: [birth year]  Today's Date: 07/15/2015 Time: 7846-9629 Time Calculation (min): 33 min  Subjective  Subjective: Pt agreeable to hydrotherapy and declines premedication for pain.  Patient and Family Stated Goals: Return home and heal wound Date of Onset: 07/10/15  Pain Score:   Pt reports minimal pain throughout session.   Wound Assessment  Wound / Incision (Open or Dehisced) 07/15/15 Incision - Open Hand Left 1 larger incision with 3 other smaller incisions surrounding (Active)  Dressing Type Compression wrap;Gauze (Comment);Moist to dry;Non adherent 07/15/2015  1:42 PM  Dressing Changed Changed 07/15/2015  1:42 PM  Dressing Status Clean;Dry;Intact 07/15/2015  1:42 PM  Dressing Change Frequency Daily 07/15/2015  1:42 PM  Site / Wound Assessment Red;Pink 07/15/2015  1:42 PM  % Wound base Red or Granulating 100% 07/15/2015  1:42 PM  Wound Length (cm) 0.5 cm 07/15/2015  1:42 PM  Wound Width (cm) 0.2 cm 07/15/2015  1:42 PM  Margins Unattached edges (unapproximated) 07/15/2015  1:42 PM  Closure None 07/15/2015  1:42 PM  Drainage Amount Minimal 07/15/2015  1:42 PM  Drainage Description Serous 07/15/2015  1:42 PM  Non-staged Wound Description Not applicable 08/10/8411  2:44 PM  Treatment Hydrotherapy (Pulse lavage);Packing (Saline gauze) 07/15/2015  1:42 PM   Hydrotherapy Pulsed lavage therapy - wound location: L hand Pulsed Lavage with Suction (psi): 8 psi (4 at times due to pain) Pulsed Lavage with Suction - Normal Saline Used: 1000 mL Pulsed Lavage Tip: Tip with splash shield   Wound Assessment and Plan  Wound Therapy - Assess/Plan/Recommendations Wound Therapy - Clinical Statement: Pt presents to hydrotherapy s/p cat bite ~5 days ago. Pt will benefit from hydrotherapy to promote wound bed healing and decrease bioburden.  Wound Therapy - Functional Problem List: Decreased strength and AROM consistent  with acute pain from cat bite Hydrotherapy Plan: Debridement;Dressing change;Patient/family education;Pulsatile lavage with suction Wound Therapy - Frequency: 6X / week Wound Therapy - Follow Up Recommendations: Home health RN Wound Plan: See above  Wound Therapy Goals- Improve the function of patient's integumentary system by progressing the wound(s) through the phases of wound healing (inflammation - proliferation - remodeling) by: Patient/Family will be able to : complete dressing changes with proper technique but without assistance. Patient/Family Instruction Goal - Progress: Goal set today Goals/treatment plan/discharge plan were made with and agreed upon by patient/family: Yes Time For Goal Achievement: 7 days Wound Therapy - Potential for Goals: Excellent  Goals will be updated until maximal potential achieved or discharge criteria met.  Discharge criteria: when goals achieved, discharge from hospital, MD decision/surgical intervention, no progress towards goals, refusal/missing three consecutive treatments without notification or medical reason.  GP     Rolinda Roan 07/15/2015, 2:04 PM   Rolinda Roan, PT, DPT Acute Rehabilitation Services Pager: [phone]

## 2015-07-16 LAB — WOUND CULTURE
GRAM STAIN: NONE SEEN
Special Requests: NORMAL

## 2015-07-17 LAB — WOUND CULTURE: Gram Stain: NONE SEEN

## 2015-07-19 LAB — ANAEROBIC CULTURE: Gram Stain: NONE SEEN

## 2015-07-19 LAB — CULTURE, BLOOD (ROUTINE X 2)
CULTURE: NO GROWTH
CULTURE: NO GROWTH

## 2019-12-30 ENCOUNTER — Emergency Department (HOSPITAL_BASED_OUTPATIENT_CLINIC_OR_DEPARTMENT_OTHER): Payer: Medicaid Other

## 2019-12-30 ENCOUNTER — Emergency Department (HOSPITAL_BASED_OUTPATIENT_CLINIC_OR_DEPARTMENT_OTHER)
Admission: EM | Admit: 2019-12-30 | Discharge: 2019-12-30 | Disposition: A | Discharge status: Home / Self Care | Payer: Medicaid Other | Attending: Emergency Medicine | Admitting: Emergency Medicine

## 2019-12-30 ENCOUNTER — Other Ambulatory Visit: Payer: Self-pay

## 2019-12-30 ENCOUNTER — Encounter (HOSPITAL_BASED_OUTPATIENT_CLINIC_OR_DEPARTMENT_OTHER): Payer: Self-pay

## 2019-12-30 DIAGNOSIS — M25561 Pain in right knee: Secondary | ICD-10-CM | POA: Diagnosis present

## 2019-12-30 MED ORDER — NAPROXEN 500 MG PO TABS: 500.0000 mg | ORAL_TABLET | Freq: Two times a day (BID) | ORAL | 0 refills | Status: AC

## 2019-12-30 NOTE — ED Notes (Signed)
Pt discharged to home. Discharge instructions have been discussed with patient and/or family members. Pt verbally acknowledges understanding d/c instructions, and endorses comprehension to checkout at registration before leaving.  °

## 2019-12-30 NOTE — ED Notes (Signed)
ED Provider at bedside. 

## 2019-12-30 NOTE — ED Triage Notes (Signed)
R knee pain x 2 days, sharp shooting pain that makes it difficult to stand. Denies injury, ambulated into triage.

## 2019-12-30 NOTE — ED Provider Notes (Signed)
[facility] EMERGENCY DEPARTMENT Provider Note  CSN: [id number] Arrival date & time: 12/30/19 1501    History Chief Complaint  Patient presents with  . Knee Pain    HPI  Sherri Garcia is a 34 y.o. female with history of osteochrondromatosis presents for evaluation of R knee pain, sudden onset while walking 2 days ago, initially pain was severe, but has improved over time. She has not taken anything for the pain. No falls or specific injury.    Past Medical History:  Diagnosis Date  . Cat bite 07/2015  . Osteochondromatosis     Past Surgical History:  Procedure Laterality Date  . ELBOW SURGERY    . HIP SURGERY     sarcoma  . I & D EXTREMITY Left 07/14/2015   Procedure: IRRIGATION AND DEBRIDEMENT EXTREMITY;  Surgeon: Roseanne Kaufman, MD;  Location: [place] [facility];  Service: Orthopedics;  Laterality: Left;  . INCISION / DRAINAGE HAND / FINGER Left 07/14/2015    Family History  Problem Relation Age of Onset  . Diabetes Brother     Social History   Tobacco Use  . Smoking status: Never Smoker  . Smokeless tobacco: Never Used  Substance Use Topics  . Alcohol use: No  . Drug use: No     Home Medications Prior to Admission medications   Medication Sig Start Date End Date Taking? Authorizing Provider  acetaminophen (TYLENOL) 500 MG tablet Take 1-2 tablets (500-1,000 mg total) by mouth every 6 (six) hours as needed for mild pain or headache. 07/15/15   Dhungel, Nishant, MD  naproxen (NAPROSYN) 500 MG tablet Take 1 tablet (500 mg total) by mouth 2 (two) times daily. 12/30/19   Truddie Hidden, MD  Prenatal Vit-Fe Fumarate-FA (PRENATAL MULTIVITAMIN) TABS tablet Take 1 tablet by mouth daily.     [provider]     Allergies    Patient has no known allergies.   Review of Systems   Review of Systems A comprehensive review of systems was completed and negative except as noted in HPI.    Physical Exam BP 123/85 (BP Location: Left Arm)   Pulse 88   Temp  98.6 F (37 C) (Oral)   Resp 16   Ht 5\' 1"  (1.549 m)   Wt 81.6 kg   LMP 12/16/2019   SpO2 100%   Breastfeeding Unknown   BMI 34.01 kg/m   Physical Exam Vitals and nursing note reviewed.  HENT:     Head: Normocephalic.     Nose: Nose normal.  Eyes:     Extraocular Movements: Extraocular movements intact.  Pulmonary:     Effort: Pulmonary effort is normal.  Musculoskeletal:        General: No swelling or deformity. Normal range of motion.     Cervical back: Neck supple.     Comments: No specific point tenderness to R knee, no joint instability, NVI  Skin:    Findings: No rash (on exposed skin).  Neurological:     Mental Status: She is alert and oriented to person, place, and time.  Psychiatric:        Mood and Affect: Mood normal.      ED Results / Procedures / Treatments   Labs (all labs ordered are listed, but only abnormal results are displayed) Labs Reviewed - No data to display  EKG None   Radiology DG Knee Complete 4 Views Right  Result Date: 12/30/2019 CLINICAL DATA:  Diffuse right knee pain for the past 2 days.  No injury. EXAM: RIGHT KNEE - COMPLETE 4+ VIEW COMPARISON:  None. FINDINGS: No acute fracture or dislocation. No joint effusion. Joint spaces are preserved. Multiple sessile and pedunculated osteochondromas involving the distal femur and proximal tibia and fibula. Bone mineralization is normal. Soft tissues are unremarkable. IMPRESSION: 1. No acute osseous abnormality or significant degenerative changes. 2. Multiple sessile and pedunculated osteochondromas, consistent with hereditary multiple exostoses. Electronically Signed   By: Titus Dubin M.D.   On: 12/30/2019 15:39    Procedures Procedures  Medications Ordered in the ED Medications - No data to display   MDM Rules/Calculators/A&P MDM Xrays reviewed and discussed with patient. She is aware of the osteochondromas since childhood, but has not followed with ortho recently. Will provide a  knee sleeve. NSAIDs and Ortho referral for follow up if not improving.  ED Course  I have reviewed the triage vital signs and the nursing notes.  Pertinent labs & imaging results that were available during my Garcia of the patient were reviewed by me and considered in my medical decision making (see chart for details).     Final Clinical Impression(s) / ED Diagnoses Final diagnoses:  Acute pain of right knee    Rx / DC Orders ED Discharge Orders         Ordered    naproxen (NAPROSYN) 500 MG tablet  2 times daily        12/30/19 1613           Truddie Hidden, MD 12/30/19 [phone]

## 2021-03-08 IMAGING — CR DG KNEE COMPLETE 4+V*R*
4 series · 4 of 4 positions shown · non-contrast
Comparison: None.

CLINICAL DATA: Diffuse right knee pain for the past 2 days. No
injury.

EXAM:
RIGHT KNEE - COMPLETE 4+ VIEW

[t knee ap right]
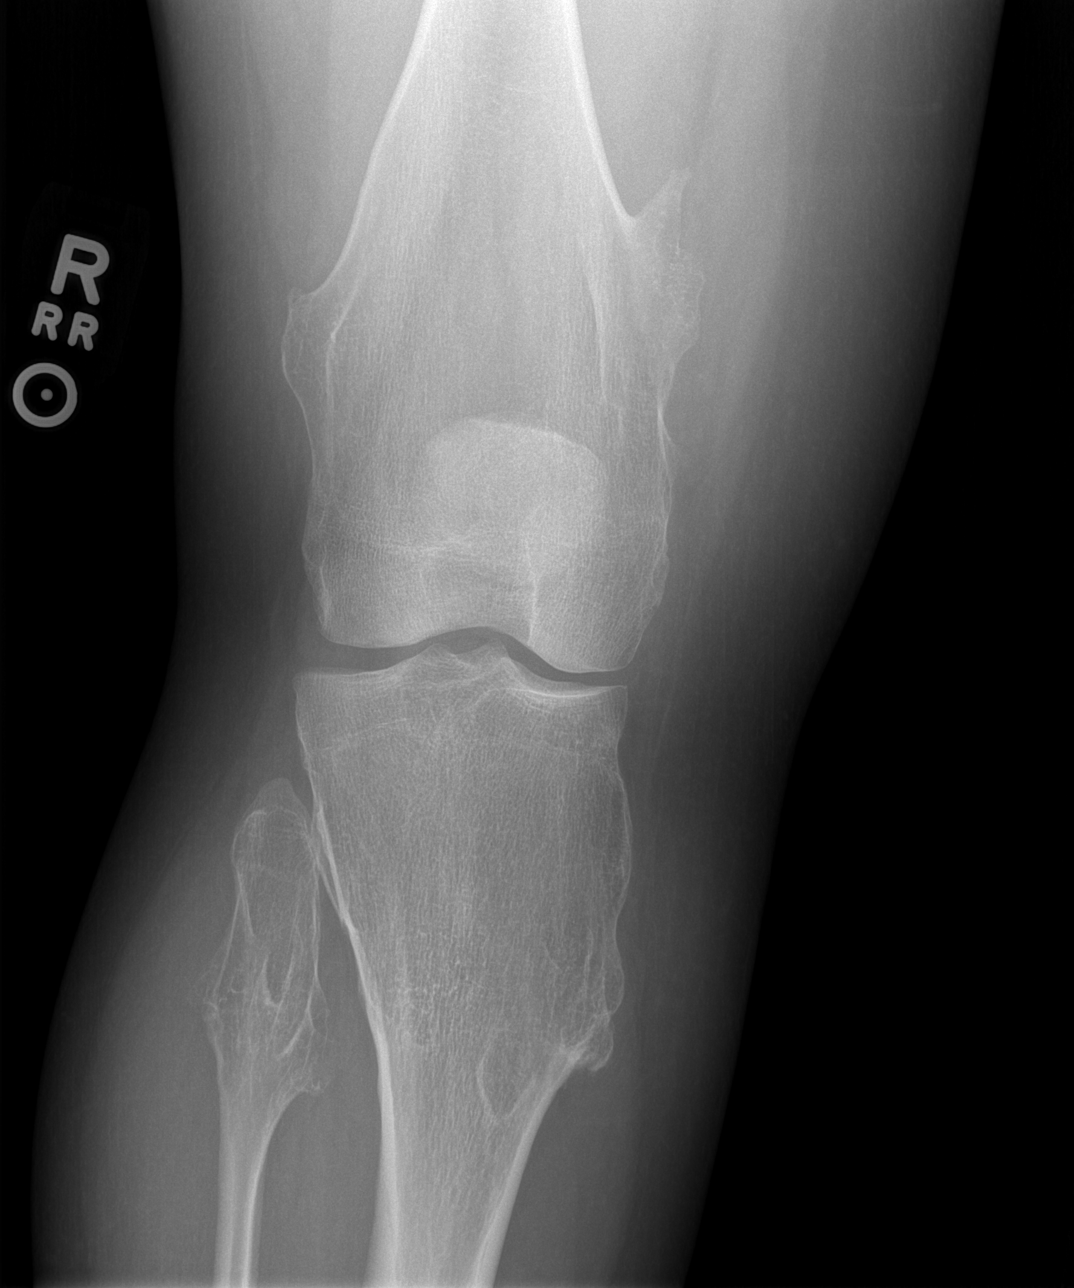

[t knee oblique right (1 of 2)]
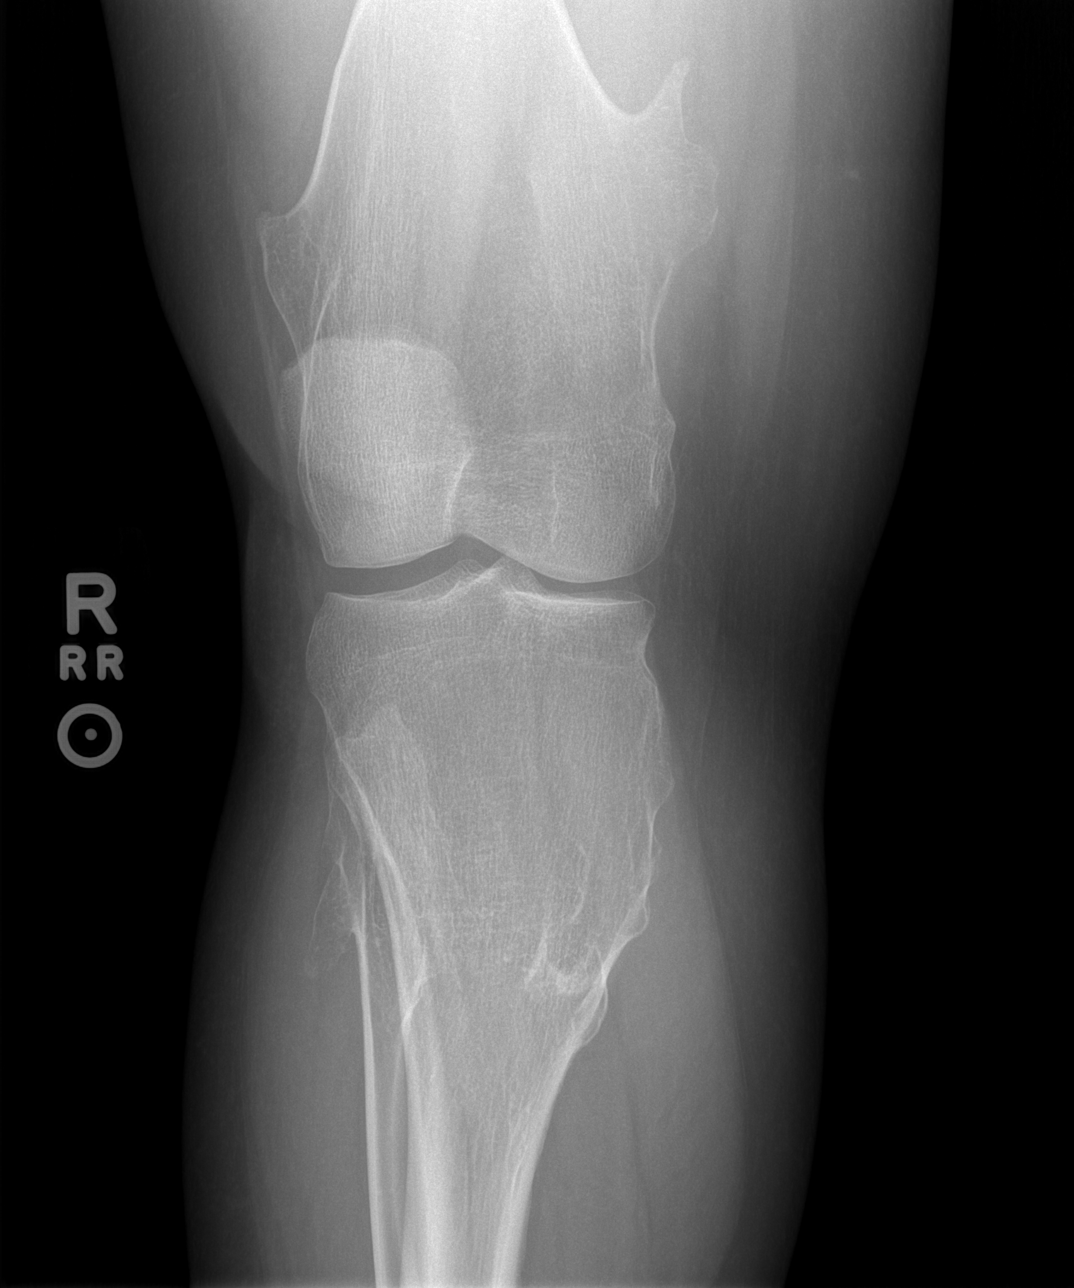

[t knee oblique right (2 of 2)]
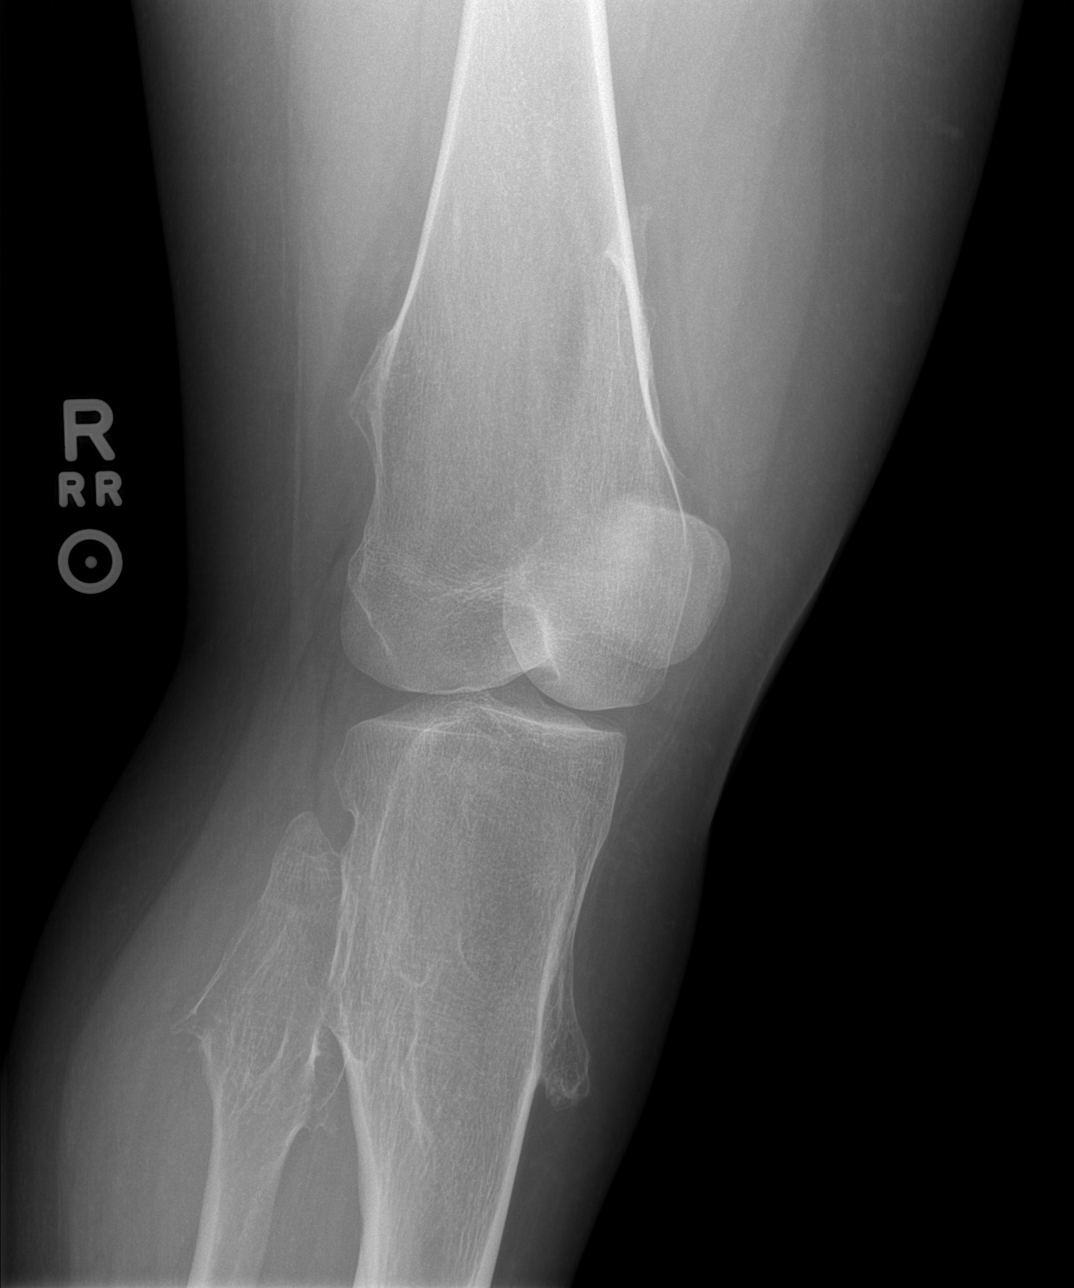

[t knee lat right]
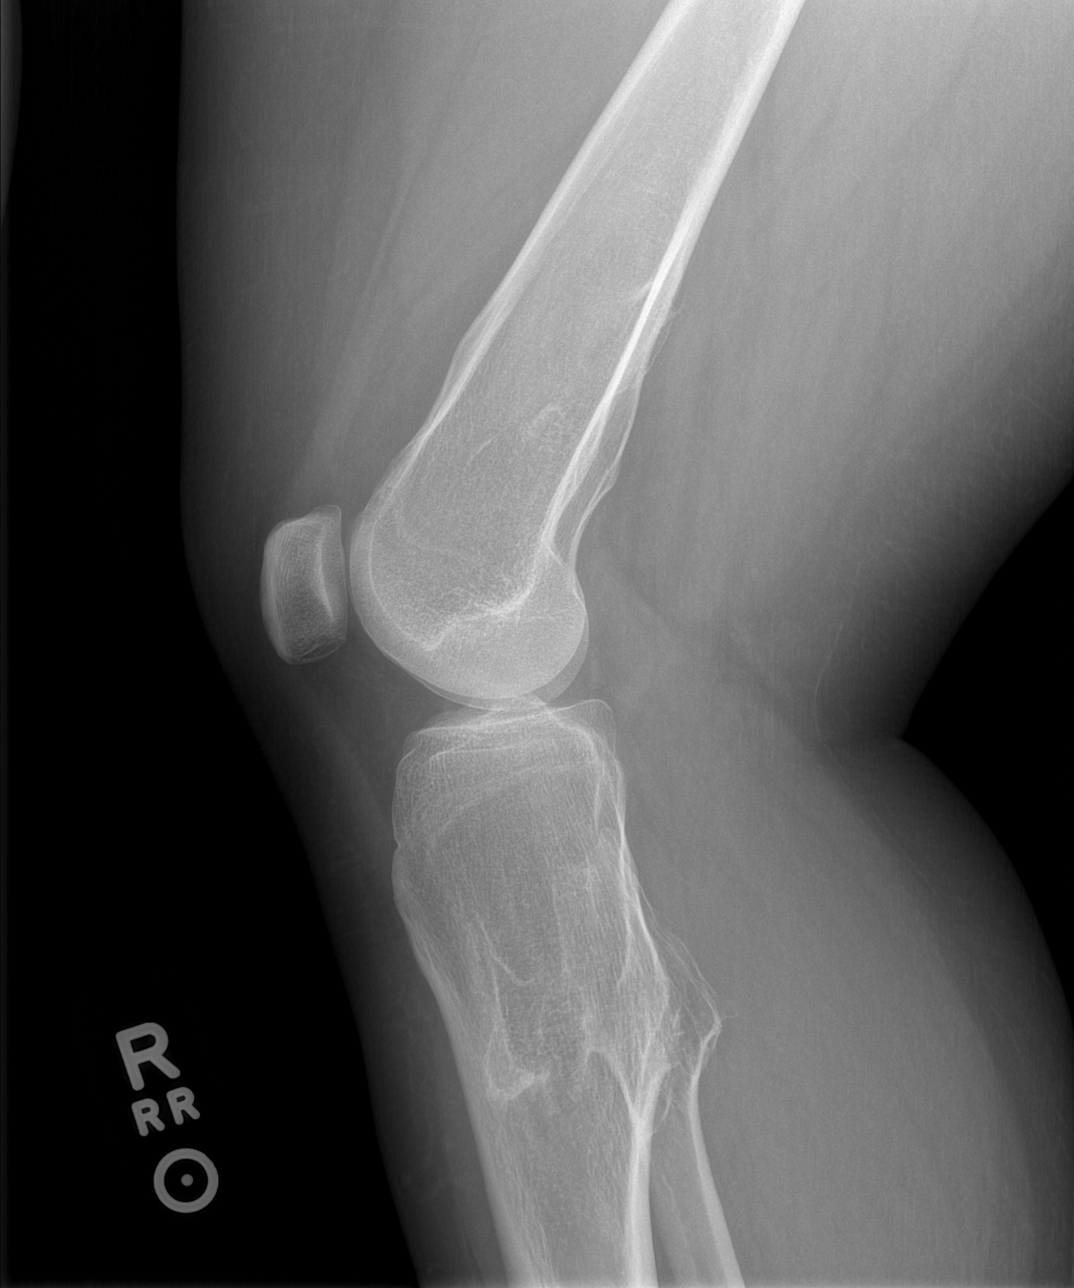

[4 of 4 positions shown; findings below may reference images not displayed]

FINDINGS: No acute fracture or dislocation. No joint effusion. Joint spaces
are preserved. Multiple sessile and pedunculated osteochondromas
involving the distal femur and proximal tibia and fibula. Bone
mineralization is normal. Soft tissues are unremarkable.
IMPRESSION: 1. No acute osseous abnormality or significant degenerative changes.
2. Multiple sessile and pedunculated osteochondromas, consistent
with hereditary multiple exostoses.

## 2021-06-11 ENCOUNTER — Emergency Department (HOSPITAL_BASED_OUTPATIENT_CLINIC_OR_DEPARTMENT_OTHER)
Admission: EM | Admit: 2021-06-11 | Discharge: 2021-06-11 | Disposition: A | Discharge status: Home / Self Care | Payer: Medicaid Other | Attending: Emergency Medicine | Admitting: Emergency Medicine

## 2021-06-11 ENCOUNTER — Encounter (HOSPITAL_BASED_OUTPATIENT_CLINIC_OR_DEPARTMENT_OTHER): Payer: Self-pay | Admitting: Emergency Medicine

## 2021-06-11 ENCOUNTER — Other Ambulatory Visit: Payer: Self-pay

## 2021-06-11 DIAGNOSIS — X58XXXA Exposure to other specified factors, initial encounter: Secondary | ICD-10-CM | POA: Diagnosis not present

## 2021-06-11 DIAGNOSIS — S79921A Unspecified injury of right thigh, initial encounter: Secondary | ICD-10-CM | POA: Diagnosis present

## 2021-06-11 DIAGNOSIS — S7011XA Contusion of right thigh, initial encounter: Secondary | ICD-10-CM | POA: Diagnosis not present

## 2021-06-11 NOTE — ED Triage Notes (Signed)
Pt arrives pov, ambulatory to triage c/o upper RLE pain x 2 weeks, denies injury, reports warmth and tenderness. Bruising was noted earlier, has resolved ?

## 2021-06-11 NOTE — ED Provider Notes (Signed)
?[facility] EMERGENCY DEPARTMENT ?Provider Note ? ? ?CSN: [id number] ?Arrival date & time: 06/11/21  1251 ? ?  ? ?History ? ?Chief Complaint  ?Patient presents with  ? Leg Pain  ? ? ?Sherri Garcia is a 36 y.o. female.  Past medical history of osteochondromatosis.  Patient presents the emergency department complaining of right posterior thigh bruising.  She says that she noticed that she had a bruise to this area about 2 weeks ago.  Says over the past 2 weeks she has noticed a small lump in this area.  She did not know if she hit her leg against anything and cause bad bruise, however she says that she has 2 kids and is not unusual for her to get bruising from various injuries.  She was really concerned about this and went to make sure it was nothing serious.  She denies any numbness, tingling, weakness, lightheadedness, chest pain, shortness of breath, abdominal pain. ? ? ?Leg Pain ? ?  ? ?Home Medications ?Prior to Admission medications   ?Medication Sig Start Date End Date Taking? Authorizing Provider  ?acetaminophen (TYLENOL) 500 MG tablet Take 1-2 tablets (500-1,000 mg total) by mouth every 6 (six) hours as needed for mild pain or headache. 07/15/15   Dhungel, Flonnie Overman, MD  ?naproxen (NAPROSYN) 500 MG tablet Take 1 tablet (500 mg total) by mouth 2 (two) times daily. 12/30/19   Truddie Hidden, MD  ?Prenatal Vit-Fe Fumarate-FA (PRENATAL MULTIVITAMIN) TABS tablet Take 1 tablet by mouth daily.     [provider]  ?   ? ?Allergies    ?Patient has no known allergies.   ? ?Review of Systems   ?Review of Systems  ?Skin:  Positive for color change.  ?All other systems reviewed and are negative. ? ?Physical Exam ?Updated Vital Signs ?BP 115/84   Pulse 87   Temp 98.7 ?F (37.1 ?C) (Oral)   Resp 16   Ht '5\' 1"'$  (1.549 m)   Wt 83.9 kg   LMP 06/04/2021   SpO2 99%   BMI 34.96 kg/m?  ?Physical Exam ?Vitals and nursing note reviewed.  ?Constitutional:   ?   General: She is not in acute distress. ?    Appearance: Normal appearance. She is well-developed. She is not ill-appearing, toxic-appearing or diaphoretic.  ?HENT:  ?   Head: Normocephalic and atraumatic.  ?   Nose: No nasal deformity.  ?   Mouth/Throat:  ?   Lips: Pink. No lesions.  ?Eyes:  ?   General: Gaze aligned appropriately. No scleral icterus.    ?   Right eye: No discharge.     ?   Left eye: No discharge.  ?   Conjunctiva/sclera: Conjunctivae normal.  ?   Right eye: Right conjunctiva is not injected. No exudate or hemorrhage. ?   Left eye: Left conjunctiva is not injected. No exudate or hemorrhage. ?Pulmonary:  ?   Effort: Pulmonary effort is normal. No respiratory distress.  ?Skin: ?   General: Skin is warm and dry.  ? ?    ?   Comments: There is a slight bruise located underneath the right buttocks on the posterior thigh.  It is very subtle.  On palpation, slightly tender to the touch with a small underlying hematoma.  Is not pulsatile.    ?Neurological:  ?   Mental Status: She is alert and oriented to person, place, and time.  ?Psychiatric:     ?   Mood and Affect: Mood normal.     ?  Speech: Speech normal.     ?   Behavior: Behavior normal. Behavior is cooperative.  ? ? ?ED Results / Procedures / Treatments   ?Labs ?(all labs ordered are listed, but only abnormal results are displayed) ?Labs Reviewed - No data to display ? ?EKG ?None ? ?Radiology ?No results found. ? ?Procedures ?Procedures  ? ?Medications Ordered in ED ?Medications - No data to display ? ?ED Course/ Medical Decision Making/ A&P ?Clinical Course as of 06/11/21 1916  ?Sat Jun 11, 2021  ?1451 Bruising to left posterior thigh right beneath buttocks. I can palpate a small possible hematoma. It is not pulsatile. Patient is able to ambulate, fully extend and flex hip. No fevers or other concerning symptoms. Recommend supportive tx, monitor area, and f/u with PCP [GL]  ?  ?Clinical Course User Index ?[GL] Mortimer Bair, Adora Fridge, PA-C  ? ?                        ?Medical Decision  Making ? ? ?MDM  ?This is a 36 y.o. female with a pertinent PMH of osteochondromatosis who presents to the ED with right hamstring bruse ?My Impression, Plan, and ED Course: Patient has normal vitals.  She looks well.  The bruise is not large and has a possible small hematoma underneath.  It has not popped also help.  According to patient, the area has been unchanged for the entirety of the 2 weeks.  She has no other systemic symptoms.  I do not suspect that she is having any large amount of bleeding.  She does not remember traumatic injury, however she says is not unusual for her to have various injuries.  She is able to range her hip without difficulties and has normal ambulation. ? ?I do not suspect that this is any serious cause to her symptoms.  I do not think that we need any imaging at this time.  I recommend supportive treatment for her such as ice packs.  She has a PCP that she can follow-up within 1 week.  If her symptoms worsen, she can feel free to be reevaluated in the emergency department. ? ? ? ? ?Charting Requirements ?Additional history is obtained from:  Independent historian ?External Records from outside source obtained and reviewed including: n/a ?Social Determinants of Health:  none ?Pertinant PMH that complicates patient's illness: osteochondromatosis ? ?Patient Care ?Problems that were addressed during this visit: ?- right thigh bruise: Self limited or minor illness ?Disposition: f/u with PCP ? ?Portions of this note were generated with [employer]. Dictation errors may occur despite best attempts at proofreading. ?  ?Final Clinical Impression(s) / ED Diagnoses ?Final diagnoses:  ?Contusion of right thigh, initial encounter  ? ? ?Rx / DC Orders ?ED Discharge Orders   ? ? None  ? ?  ? ? ?  ?Adolphus Birchwood, PA-C ?06/11/21 1916 ? ?  ?Deno Etienne, DO ?06/11/21 2154 ? ?

## 2021-06-11 NOTE — Discharge Instructions (Signed)
You can take tylenol and motrin for your pain if you experience that. Ice packs to area may be helpful. I would keep an eye on the area. If it continues to improve, it will likely get better on its own. If the area worsens or you develop new concerning symptoms, please return to the ED or be seen by your PCP.  ?

## 2022-06-30 ENCOUNTER — Other Ambulatory Visit: Payer: Self-pay

## 2022-06-30 ENCOUNTER — Encounter (HOSPITAL_BASED_OUTPATIENT_CLINIC_OR_DEPARTMENT_OTHER): Payer: Self-pay | Admitting: Emergency Medicine

## 2022-06-30 ENCOUNTER — Emergency Department (HOSPITAL_BASED_OUTPATIENT_CLINIC_OR_DEPARTMENT_OTHER): Payer: Medicaid Other

## 2022-06-30 ENCOUNTER — Emergency Department (HOSPITAL_BASED_OUTPATIENT_CLINIC_OR_DEPARTMENT_OTHER)
Admission: EM | Admit: 2022-06-30 | Discharge: 2022-06-30 | Disposition: A | Discharge status: Home / Self Care | Payer: Medicaid Other | Attending: Emergency Medicine | Admitting: Emergency Medicine

## 2022-06-30 DIAGNOSIS — M25561 Pain in right knee: Secondary | ICD-10-CM | POA: Insufficient documentation

## 2022-06-30 NOTE — ED Provider Notes (Signed)
Emergency Department Provider Note   I have reviewed the triage vital signs and the nursing notes.   HISTORY  Chief Complaint Knee Pain   HPI Sherri Garcia is a 37 y.o. female presents emergency department for evaluation of knee pain.  She has osteochondromatosis and known pain related to these issues.  She has had right knee pain for the past 6 weeks but worsening over the past 2 weeks.  No injury or falls.  No fevers or chills. She does not take home pain or OTC medications.    Past Medical History:  Diagnosis Date   Cat bite 07/2015   Osteochondromatosis     Review of Systems  Constitutional: No fever/chills Cardiovascular: Denies chest pain. Respiratory: Denies shortness of breath. Gastrointestinal: No abdominal pain.   Musculoskeletal: Positive right knee pain.   ____________________________________________   PHYSICAL EXAM:  VITAL SIGNS: ED Triage Vitals  Enc Vitals Group     BP 06/30/22 1133 94/73     Pulse Rate 06/30/22 1133 90     Resp 06/30/22 1133 18     Temp 06/30/22 1133 98.1 F (36.7 C)     Temp Source 06/30/22 1133 Oral     SpO2 06/30/22 1133 100 %     Weight 06/30/22 1128 185 lb (83.9 kg)     Height 06/30/22 1128 5\' 1"  (1.549 m)   Constitutional: Alert and oriented. Well appearing and in no acute distress. Eyes: Conjunctivae are normal. Head: Atraumatic. Nose: No congestion/rhinnorhea. Mouth/Throat: Mucous membranes are moist.   Neck: No stridor.   Cardiovascular: Good peripheral circulation.  Respiratory: Normal respiratory effort.   Gastrointestinal: No distention.  Musculoskeletal: Patient is ambulatory with a slight limp.  Normal range of motion of the right knee.  No large joint effusion, erythema, warmth.  No ankle tenderness.  Normal range of motion of the right hip. No joint laxity.  Neurologic:  Normal speech and language.  Skin:  Skin is warm, dry and intact. No rash  noted.  ____________________________________________  RADIOLOGY  DG Knee Complete 4 Views Right  Result Date: 06/30/2022 CLINICAL DATA:  Right knee pain for 1.5 months, increasing over the past 2 weeks. 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 EXAM: RIGHT KNEE - COMPLETE 4+ VIEW COMPARISON:  Right knee radiographs 12/30/2019 FINDINGS: Redemonstration of widening of the distal femoral and proximal tibia and fibular meta diaphyses with multiple bony excrescences, including pedunculated medial greater than lateral distal femoral metadiaphyseal and posterolateral proximal fibular osteochondromas and somewhat more sessile proximal medial and lateral tibial osteochondromas. No acute fracture is seen. Mild medial compartment of the knee joint space narrowing. No knee joint effusion. IMPRESSION: Distal femoral and proximal tibial and fibular sessile and pedunculated osteochondromas, again consistent with multiple hereditary exostoses. No acute fracture is seen. Electronically Signed   By: Yvonne Kendall M.D.   On: 06/30/2022 11:57    ____________________________________________   PROCEDURES  Procedure(s) performed:   Procedures  None  ____________________________________________   INITIAL IMPRESSION / ASSESSMENT AND PLAN / ED COURSE  Pertinent labs & imaging results that were available during my care of the patient were reviewed by me and considered in my medical decision making (see chart for details).   This patient is Presenting for Evaluation of knee pain, which does require a range of treatment options, and is a complaint that involves a moderate risk of morbidity and mortality.  The Differential Diagnoses includes arthritis, fracture, dislocation, septic joint, compartment syndrome, etc.  Radiologic Tests Ordered, included  knee x-ray. I independently interpreted the images and agree with radiology interpretation.   Medical Decision Making: Summary:  Patient presents  emergency department with right knee pain for the past 2 weeks.  Suspect pain is likely due to her underlying osteochondromatosis.  No acute findings on x-ray of the knee.  No evidence to suspect septic process or gout.  Declined anti-inflammatory medication.  She plans to call her orthopedist for follow-up in the coming week.   Patient's presentation is most consistent with exacerbation of chronic illness.   Disposition: discharge  ____________________________________________  FINAL CLINICAL IMPRESSION(S) / ED DIAGNOSES  Final diagnoses:  Acute pain of right knee    Note:  This document was prepared using Dragon voice recognition software and may include unintentional dictation errors.  Nanda Quinton, MD, [facility] Emergency Medicine    Cayman Kielbasa, [facility], MD 07/01/22 [phone]

## 2022-06-30 NOTE — ED Triage Notes (Signed)
Right knee pain x 1 1/2 month's and worse over past 2 weeks. Denies recent injury

## 2022-06-30 NOTE — Discharge Instructions (Signed)
Please call the orthopedic office to schedule close follow up.
